# Patient Record
Sex: Male | Born: 1970 | Race: White | Hispanic: No | Marital: Single | Smoking: Never smoker
Health system: Southern US, Community
[De-identification: ages and names within clinical notes are randomized; demographics above are authoritative.]

## PROBLEM LIST (undated history)

## (undated) DIAGNOSIS — J45909 Unspecified asthma, uncomplicated: Secondary | ICD-10-CM

## (undated) HISTORY — PX: THYROIDECTOMY, PARTIAL: SHX18

## (undated) HISTORY — PX: ADENOIDECTOMY: SUR15

## (undated) HISTORY — PX: TYMPANOSTOMY TUBE PLACEMENT: SHX32

## (undated) HISTORY — PX: NASAL SINUS SURGERY: SHX719

## (undated) HISTORY — PX: TONSILLECTOMY: SUR1361

## (undated) HISTORY — DX: Unspecified asthma, uncomplicated: J45.909

---

## 2010-05-27 ENCOUNTER — Emergency Department (HOSPITAL_COMMUNITY): Admission: EM | Admit: 2010-05-27 | Discharge: 2010-05-27 | Payer: Self-pay | Admitting: Emergency Medicine

## 2011-01-13 LAB — CBC
MCHC: 34.4 g/dL (ref 30.0–36.0)
RDW: 13.9 % (ref 11.5–15.5)
WBC: 6.4 10*3/uL (ref 4.0–10.5)

## 2011-01-13 LAB — POCT I-STAT, CHEM 8
Glucose, Bld: 96 mg/dL (ref 70–99)
HCT: 43 % (ref 39.0–52.0)
Hemoglobin: 14.6 g/dL (ref 13.0–17.0)
Potassium: 4.3 mEq/L (ref 3.5–5.1)
Sodium: 138 mEq/L (ref 135–145)

## 2011-01-13 LAB — COMPREHENSIVE METABOLIC PANEL
AST: 36 U/L (ref 0–37)
Alkaline Phosphatase: 80 U/L (ref 39–117)
CO2: 26 mEq/L (ref 19–32)
Calcium: 8.9 mg/dL (ref 8.4–10.5)
Creatinine, Ser: 1.07 mg/dL (ref 0.4–1.5)
GFR calc non Af Amer: >60 mL/min (ref >60)
Potassium: 4.3 mEq/L (ref 3.5–5.1)
Sodium: 138 mEq/L (ref 135–145)

## 2011-01-13 LAB — DIFFERENTIAL
Basophils Absolute: 0 10*3/uL (ref 0.0–0.1)
Basophils Relative: 0 % (ref 0–1)
Eosinophils Absolute: 0.4 10*3/uL (ref 0.0–0.7)
Eosinophils Relative: 6 % — ABNORMAL HIGH (ref 0–5)
Lymphs Abs: 1.4 10*3/uL (ref 0.7–4.0)
Monocytes Relative: 10 % (ref 3–12)
Neutro Abs: 3.9 10*3/uL (ref 1.7–7.7)

## 2011-01-13 LAB — URINALYSIS, ROUTINE W REFLEX MICROSCOPIC
Ketones, ur: NEGATIVE mg/dL
Leukocytes, UA: NEGATIVE
Nitrite: NEGATIVE
Urobilinogen, UA: 0.2 mg/dL (ref 0.0–1.0)

## 2011-01-13 LAB — URINE MICROSCOPIC-ADD ON

## 2011-01-13 LAB — LIPASE, BLOOD: Lipase: 30 U/L (ref 11–59)

## 2012-01-05 ENCOUNTER — Encounter (HOSPITAL_COMMUNITY): Payer: Self-pay | Admitting: *Deleted

## 2012-01-05 ENCOUNTER — Emergency Department (HOSPITAL_COMMUNITY)
Admission: EM | Admit: 2012-01-05 | Discharge: 2012-01-05 | Disposition: A | Discharge status: Home / Self Care | Payer: PRIVATE HEALTH INSURANCE | Attending: Emergency Medicine | Admitting: Emergency Medicine

## 2012-01-05 DIAGNOSIS — R599 Enlarged lymph nodes, unspecified: Secondary | ICD-10-CM | POA: Insufficient documentation

## 2012-01-05 DIAGNOSIS — R591 Generalized enlarged lymph nodes: Secondary | ICD-10-CM

## 2012-01-05 MED ORDER — HYDROCODONE-ACETAMINOPHEN 5-325 MG PO TABS
1.0000 | ORAL_TABLET | Freq: Once | ORAL | Status: AC
  Administered 2012-01-05: 1 via ORAL
  Filled 2012-01-05: qty 1

## 2012-01-05 MED ORDER — HYDROCODONE-ACETAMINOPHEN 5-325 MG PO TABS: ORAL_TABLET | ORAL | Status: AC

## 2012-01-05 NOTE — ED Provider Notes (Signed)
History     CSN: [id number]  Arrival date & time 01/05/12  1706   First MD Initiated Contact with Patient 01/05/12 1746      Chief Complaint  Patient presents with  . swollen neck gland     (Consider location/radiation/quality/duration/timing/severity/associated sxs/prior treatment) HPI Comments: Patient presents with 2 day history of the swelling underneath his right jaw. Patient states that he has a sore throat as well. Denies trouble swallowing or breathing. No shortness of breath. Patient was seen by his primary care physician today and prescribed dicloxacillin. Patient states that the swelling continued to get worse and is painful so he decided to come to the emergency department for evaluation. No fever. No nasal congestion, runny nose. Patient has taken Motrin without relief. Nothing makes symptoms better. Swallowing makes the symptoms worse.  The history is provided by the patient.    History reviewed. No pertinent past medical history.  Past Surgical History  Procedure Date  . Tonsillectomy   . Thyroidectomy, partial   . Nasal sinus surgery     History reviewed. No pertinent family history.  History  Substance Use Topics  . Smoking status: Never Smoker   . Smokeless tobacco: Not on file  . Alcohol Use: No      Review of Systems  Constitutional: Negative for fever.  HENT: Positive for sore throat. Negative for ear pain, rhinorrhea and dental problem.   Eyes: Negative for redness.  Respiratory: Negative for cough, shortness of breath and stridor.   Gastrointestinal: Negative for nausea, vomiting, abdominal pain and diarrhea.  Genitourinary: Negative for dysuria.  Musculoskeletal: Negative for myalgias.  Skin: Negative for rash.  Neurological: Negative for headaches.  Hematological: Positive for adenopathy.    Allergies  Latex  Home Medications   Current Outpatient Rx  Name Route Sig Dispense Refill  . ALBUTEROL SULFATE HFA 108 (90 BASE) MCG/ACT IN  AERS Inhalation Inhale 2 puffs into the lungs every 6 (six) hours as needed. Shortness of breath cough    . DM-GUAIFENESIN ER 30-600 MG PO TB12 Oral Take 1 tablet by mouth every 12 (twelve) hours.    Marland Kitchen DEXTROMETHORPHAN-GUAIFENESIN 10-100 MG/5ML PO SYRP Oral Take 5 mLs by mouth every 4 (four) hours.    Marland Kitchen DICLOXACILLIN SODIUM 500 MG PO CAPS Oral Take 500 mg by mouth 4 (four) times daily.    Marland Kitchen LORATADINE 10 MG PO TABS Oral Take 10 mg by mouth daily.    Marland Kitchen OMEPRAZOLE 20 MG PO CPDR Oral Take 20 mg by mouth 2 (two) times daily.    Marland Kitchen ROSUVASTATIN CALCIUM 20 MG PO TABS Oral Take 20 mg by mouth daily.      BP 155/104  Pulse 107  Temp(Src) 99.1 F (37.3 C) (Oral)  Resp 20  SpO2 97%  Physical Exam  Nursing note and vitals reviewed. Constitutional: He is oriented to person, place, and time. He appears well-developed and well-nourished.  HENT:  Head: Normocephalic and atraumatic.  Right Ear: Hearing, tympanic membrane and ear canal normal.  Left Ear: Hearing, tympanic membrane and ear canal normal.  Nose: Nose normal.  Mouth/Throat: Uvula is midline and oropharynx is clear and moist.  Eyes: Conjunctivae are normal. Right eye exhibits no discharge. Left eye exhibits no discharge.  Neck: Normal range of motion. Neck supple.       3-4 cm, tender, right tonsillar lymph node palpable. There is no overlying or surrounding erythema.  Cardiovascular: Normal rate, regular rhythm and normal heart sounds.   Pulmonary/Chest: Effort normal  and breath sounds normal.  Abdominal: Soft. There is no tenderness.  Lymphadenopathy:    He has cervical adenopathy.  Neurological: He is alert and oriented to person, place, and time.  Skin: Skin is warm and dry.  Psychiatric: He has a normal mood and affect.    ED Course  Procedures (including critical care time)  Labs Reviewed - No data to display No results found.   1. Lymphadenopathy     6:25 PM Patient seen and examined. Medications ordered.   Vital  signs reviewed and are as follows: Filed Vitals:   01/05/12 1757  BP: 155/104  Pulse:   Temp:   Resp:    BP 155/104  Pulse 107  Temp(Src) 99.1 F (37.3 C) (Oral)  Resp 20  SpO2 97%  Patient urged to continue antibiotics and followup with primary care physician next week. Patient urged to return with worsening of swelling, trouble breathing, persistent fever, or other concerns. Patient verbalizes understanding and agrees with plan. Patient informed of that he will need to see his primary care doctor if he continues to have swelling for more than 2 weeks.  6:25 PM Patient counseled on use of narcotic pain medications. Counseled not to combine these medications with others containing tylenol. Urged not to drink alcohol, drive, or perform any other activities that requires focus while taking these medications. The patient verbalizes understanding and agrees with the plan.   MDM  Patient was sore throat and right tonsillar swelling consistent with a reactive lymph node. Patient has only had this for 2 days. He denies lymphadenopathy elsewhere. Do not suspect abscess or lymphadenitis. Do not suspect deep space neck infection or dental abscess. No signs of Ludwig angina. No fever. Will have patient continue antibiotics to treat a possible infection. He has primary care followup.        Renne Crigler, [place] 01/05/12 [phone]

## 2012-01-05 NOTE — ED Notes (Signed)
Swollen gland in his rt neck since Thursday evening. He was seen at urgent care this am but is not satisfied and came to have another visit

## 2012-01-05 NOTE — Discharge Instructions (Signed)
Please read and follow all provided instructions.  Your diagnoses today include:  1. Lymphadenopathy     Tests performed today include:  Vital signs. See below for your results today.   Medications prescribed:   Vicodin (hydrocodone/acetaminophen) - narcotic pain medication  You have been prescribed narcotic pain medication such as Vicodin or Percocet: DO NOT drive or perform any activities that require you to be awake and alert because this medicine can make you drowsy. Do not take any other medications that contain tylenol while taking this medication because you might take too much.   Take any prescribed medications only as directed.  Home care instructions:  Follow any educational materials contained in this packet.  Continue taking antibiotics as prescribed by your doctor.   Follow-up instructions: Please follow-up with your primary care provider in the next 3 days for further evaluation of your symptoms. If you do not have a primary care doctor -- see below for referral information.   Return instructions:   Please return to the Emergency Department if you experience worsening symptoms, worsening swelling of your throat, trouble breathing, persistent fever.  Please return if you have any other emergent concerns.  Additional Information:  Your vital signs today were: BP 155/104  Pulse 107  Temp(Src) 99.1 F (37.3 C) (Oral)  Resp 20  SpO2 97% If your blood pressure (BP) was elevated above 135/85 this visit, please have this repeated by your doctor within one month. -------------- No Primary Care Doctor Call [facility]  [phone] Other agencies that provide inexpensive medical care    [facility]  [phone]    [facility]  [phone]    [facility]  [phone]    [facility]  [phone]    [facility]  [phone]    [facility]  [phone] -------------- RESOURCE GUIDE:  Dental Problems  Patients with  Medicaid: [facility] [phone]                                            [phone] [address] [employer] Phone:  [phone]                                                   Phone:  [phone]  If unable to pay or uninsured, contact:  [facility] or [facility]. to become qualified for the adult dental clinic.  Chronic Pain Problems Contact [facility]  [phone] Patients need to be referred by their primary care doctor.  Insufficient Money for Medicine Contact [employer]:  call "211" or [facility] [phone].  Psychological Services [facility]  [phone] [facility]  [phone] [facility]   [phone] (emergency services [phone])  Substance Abuse Resources [facility]  [phone] [facility] [phone] [facility] [place] [phone] Floydene Flock [phone] Residential & Outpatient Substance Abuse Program  [phone]  Abuse/Neglect [facility] Child Abuse Hotline [phone] [facility] Child Abuse Hotline [phone] (After Hours)  Emergency Shelter [facility] [phone]  Maternity Homes  Room at the [facility] [phone] [employer] Services [phone]  [facility] Resources  [facility] [place]     [employer]                          [facility] Dept. [address]                        [address]      [address] [place] [address]  Blondell Reveal Phone:  [phone]                                   Phone:  [phone]                 Phone:  [phone]  [facility] Phone:  [phone]  [facility] Child Abuse Hotline [phone] (After Hours)

## 2012-01-05 NOTE — ED Provider Notes (Signed)
Medical screening examination/treatment/procedure(s) were performed by non-physician practitioner and as supervising physician I was immediately available for consultation/collaboration.  Gerhard Munch, MD 01/05/12 [phone]

## 2014-12-27 ENCOUNTER — Other Ambulatory Visit: Payer: Self-pay | Admitting: Family Medicine

## 2014-12-27 ENCOUNTER — Ambulatory Visit
Admission: RE | Admit: 2014-12-27 | Discharge: 2014-12-27 | Disposition: A | Discharge status: Home / Self Care | Payer: BLUE CROSS/BLUE SHIELD | Source: Ambulatory Visit | Attending: Family Medicine | Admitting: Family Medicine

## 2014-12-27 DIAGNOSIS — S93601A Unspecified sprain of right foot, initial encounter: Secondary | ICD-10-CM

## 2014-12-27 DIAGNOSIS — T148XXA Other injury of unspecified body region, initial encounter: Secondary | ICD-10-CM

## 2014-12-29 ENCOUNTER — Other Ambulatory Visit: Payer: Self-pay | Admitting: Family Medicine

## 2014-12-29 DIAGNOSIS — R9389 Abnormal findings on diagnostic imaging of other specified body structures: Secondary | ICD-10-CM

## 2014-12-29 DIAGNOSIS — S99921D Unspecified injury of right foot, subsequent encounter: Secondary | ICD-10-CM

## 2014-12-30 ENCOUNTER — Ambulatory Visit
Admission: RE | Admit: 2014-12-30 | Discharge: 2014-12-30 | Disposition: A | Discharge status: Home / Self Care | Payer: BLUE CROSS/BLUE SHIELD | Source: Ambulatory Visit | Attending: Family Medicine | Admitting: Family Medicine

## 2014-12-30 DIAGNOSIS — S99921D Unspecified injury of right foot, subsequent encounter: Secondary | ICD-10-CM

## 2014-12-30 DIAGNOSIS — R9389 Abnormal findings on diagnostic imaging of other specified body structures: Secondary | ICD-10-CM

## 2015-01-06 ENCOUNTER — Other Ambulatory Visit: Payer: BLUE CROSS/BLUE SHIELD

## 2015-10-21 IMAGING — CR DG FOOT COMPLETE 3+V*R*
3 series · 3 of 3 positions shown · non-contrast
Comparison: None.

CLINICAL DATA: Injury of the right foot on December 22, 2014 with
persistent pain and swelling

EXAM:
RIGHT FOOT COMPLETE - 3+ VIEW

[t foot ap right]
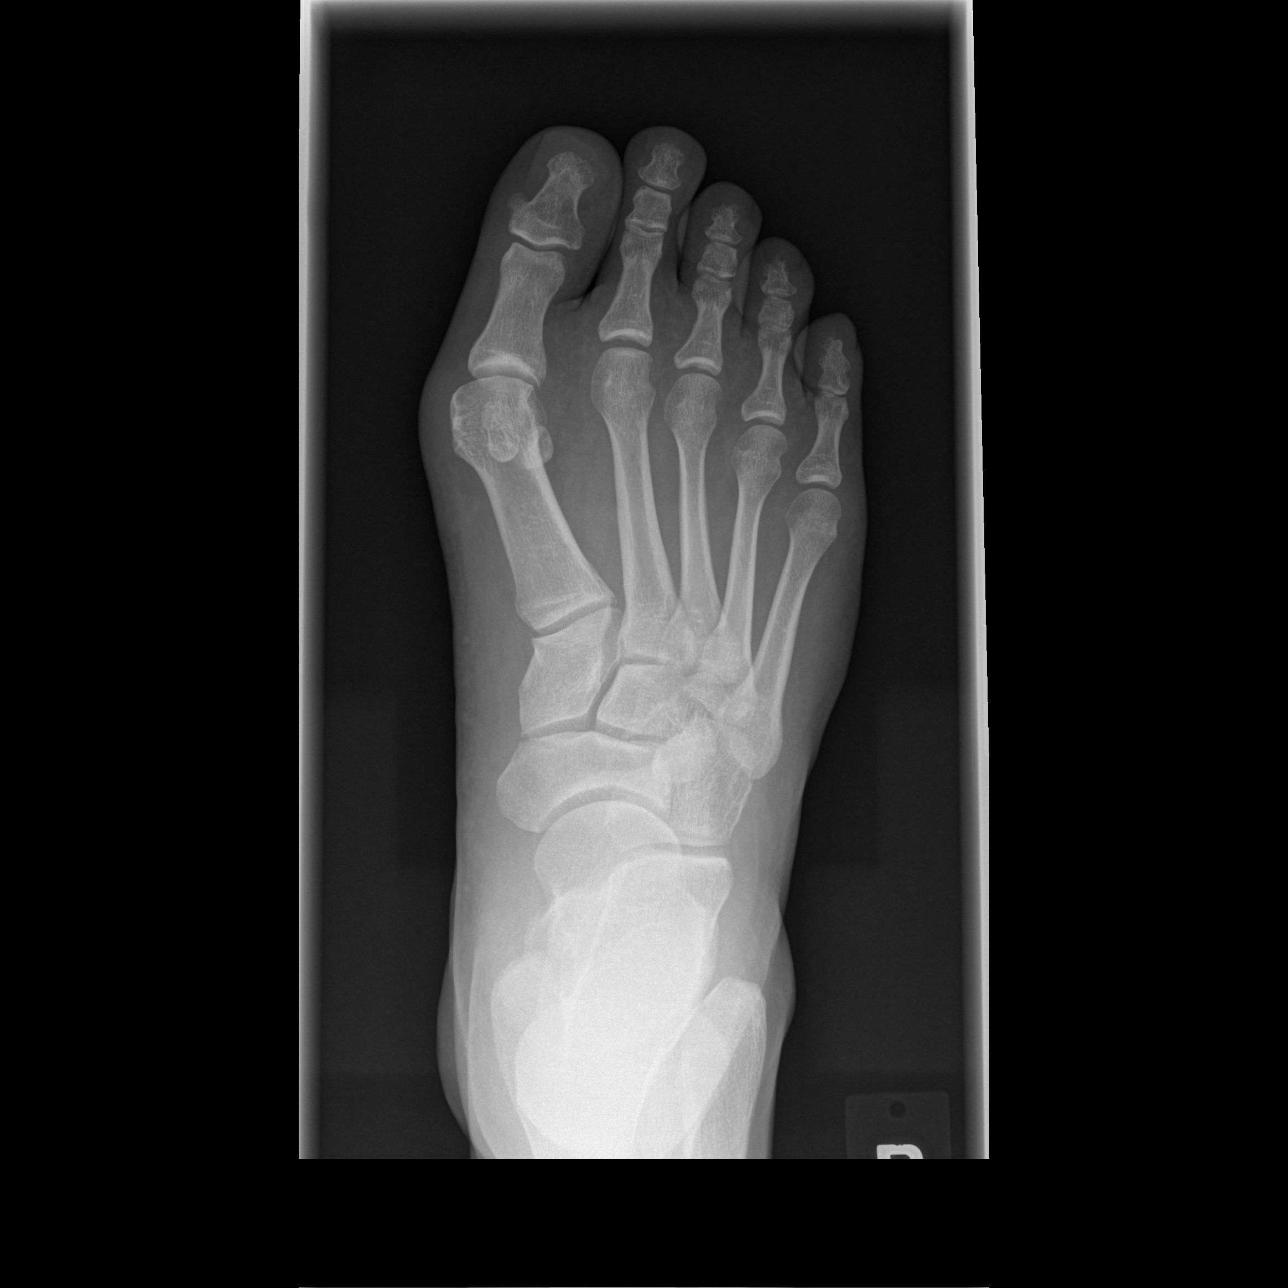

[t foot oblique right]
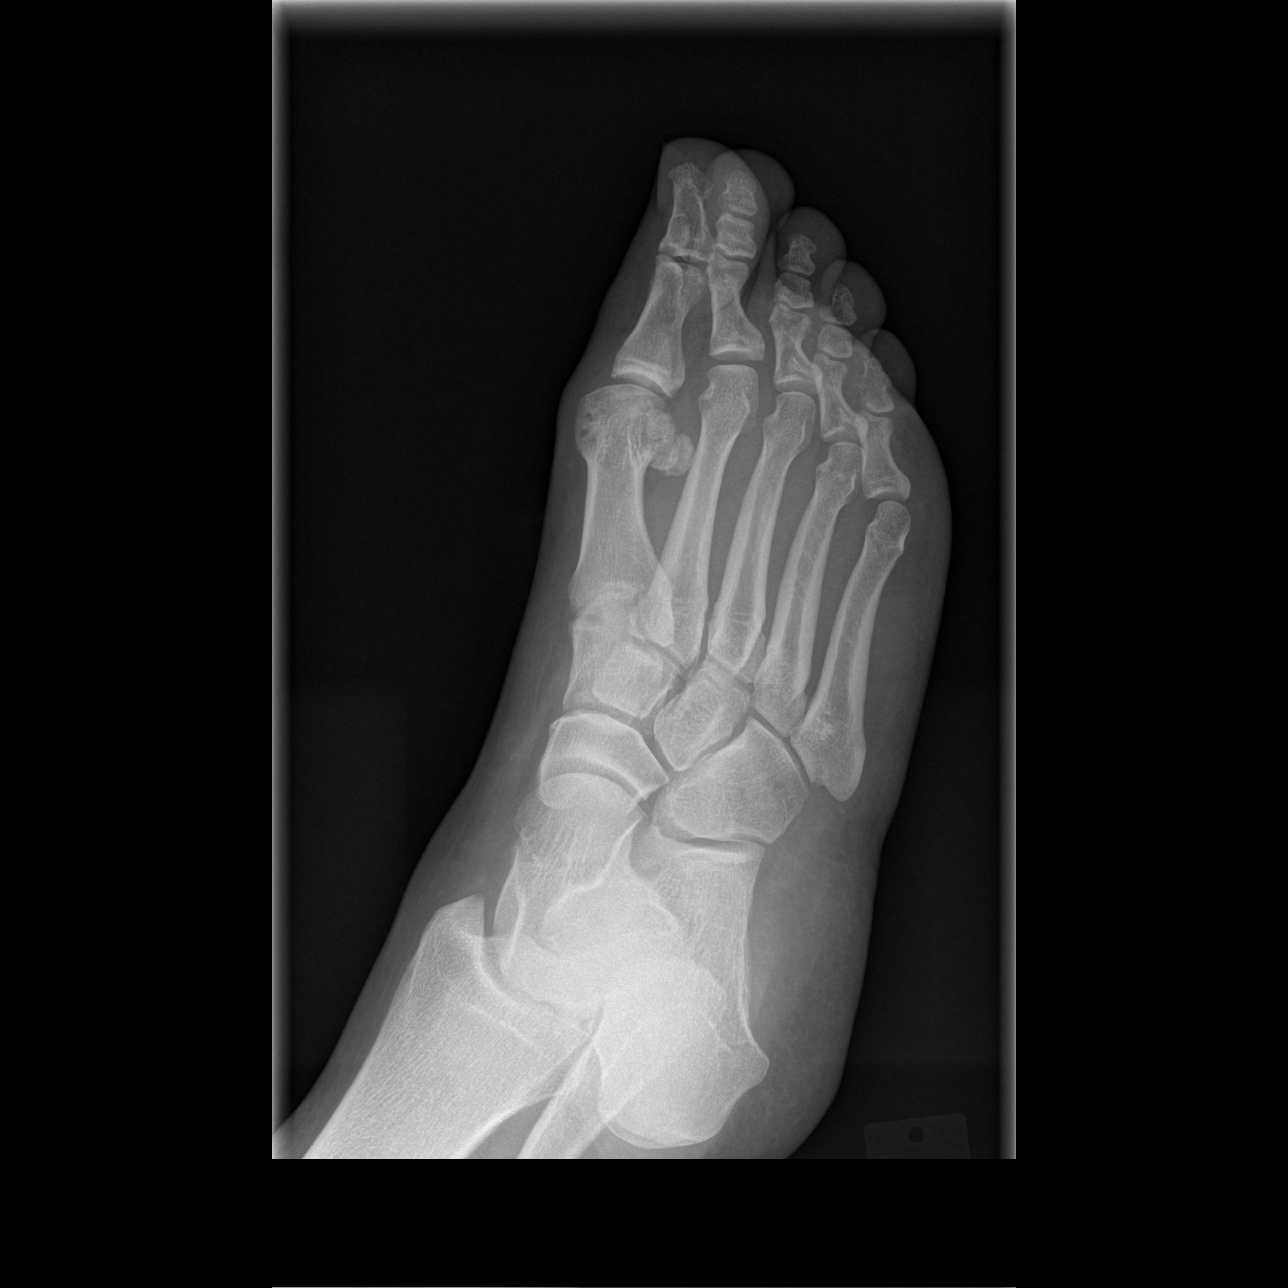

[t foot lat right]
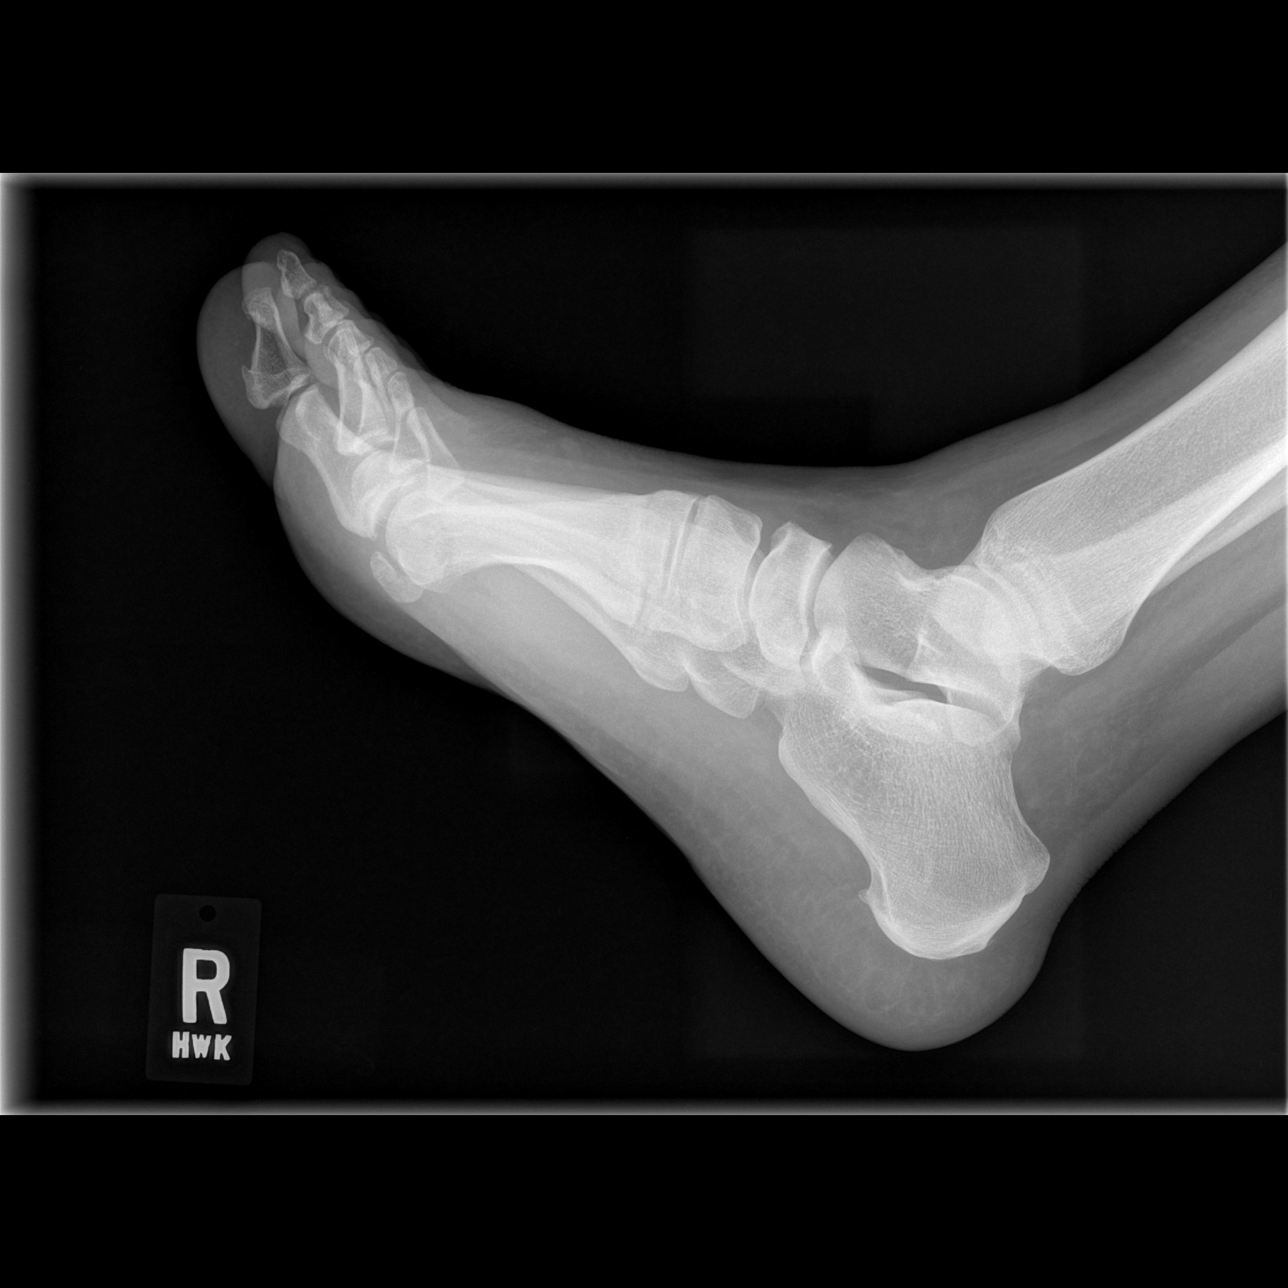

[3 of 3 positions shown; findings below may reference images not displayed]

FINDINGS: The bones of the right foot are adequately mineralized. There is a
hallux valgus contour of the first ray. The interphalangeal and
metatarsophalangeal joint spaces are reasonably well maintained. On
the AP view the articulation of the third cuneiform and cuboid with
the third and fourth metatarsals is unusual in appearance. On the
other views definite no definite abnormality is seen here. The talus
and calcaneus are unremarkable. There is a small plantar calcaneal
spur. There is soft tissue swelling over the midfoot.
IMPRESSION: Unusual contour of the lateral tarsometatarsal joints on the oblique
view merits further evaluation with CT scanning of the right foot to
exclude an occult fracture/dislocation.

## 2015-10-24 IMAGING — CT CT FOOT*R* W/O CM
4 series · 17 of 34 positions shown, 19 images · non-contrast
Comparison: Radiographs dated 12/27/2014

CLINICAL DATA: Pain and swelling of the right foot since an injury
on 12/22/2014. Abnormal radiographs dated 12/27/2014.

EXAM:
CT OF THE RIGHT FOOT WITHOUT CONTRAST
TECHNIQUE: Multidetector CT imaging of the right foot was performed according
to the standard protocol. Multiplanar CT image reconstructions were
also generated.

[Series 5: lower ext soft · axial · 0.54mm/px · z∈[-158,-106]mm · 3 of 63 slices shown]
[im 11/63  soft-tissue]
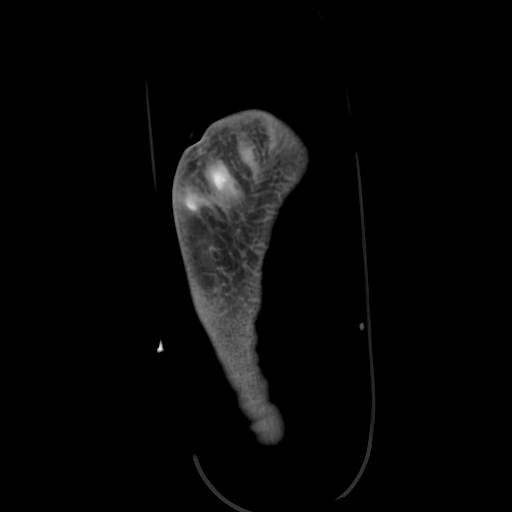
[im 21/63  soft-tissue]
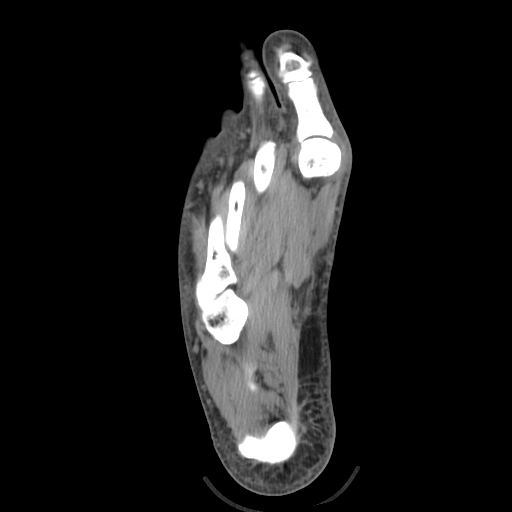
[im 32/63  soft-tissue]
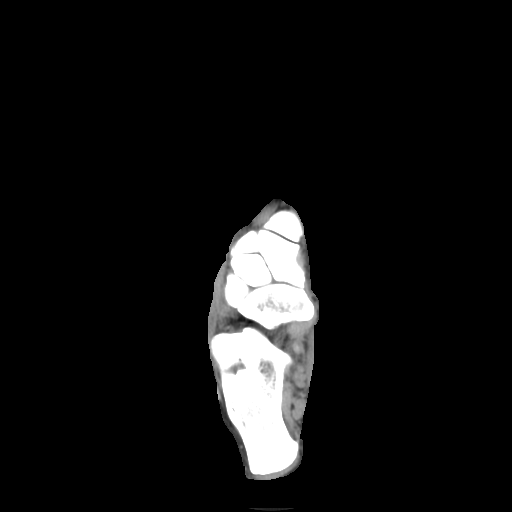

[Series 300: axial soft · coronal · 0.31mm/px · 3 of 125 slices shown]
[im 54/125  bone]
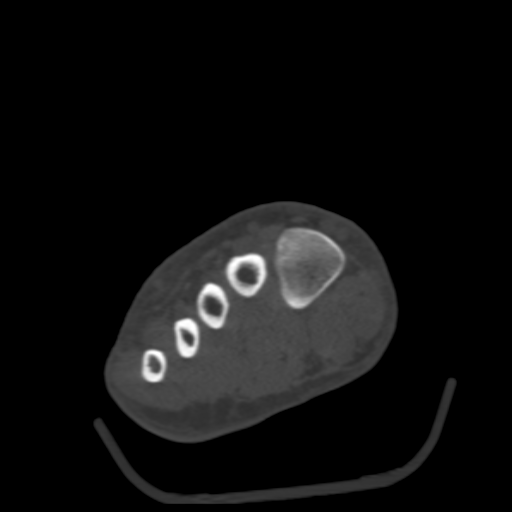
[im 67/125  bone]
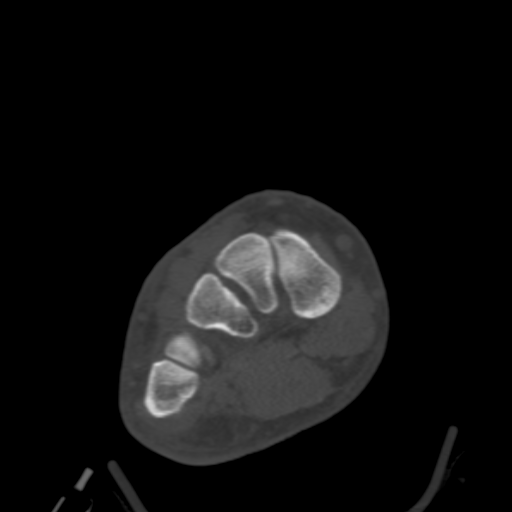
[im 80/125  bone]
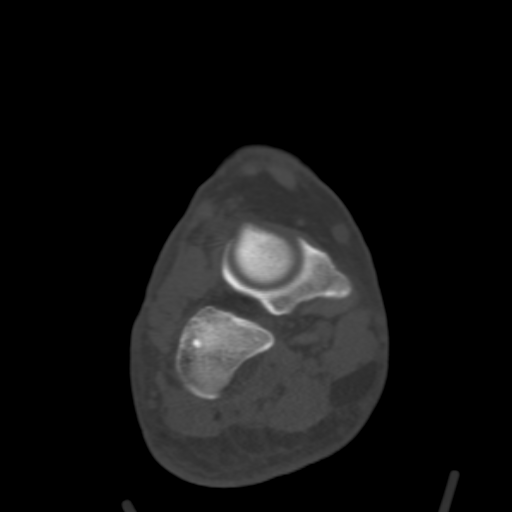

[Series 301: sag foot soft · sagittal · 0.53mm/px · 6 of 52 slices shown]
[im 18/52  bone]
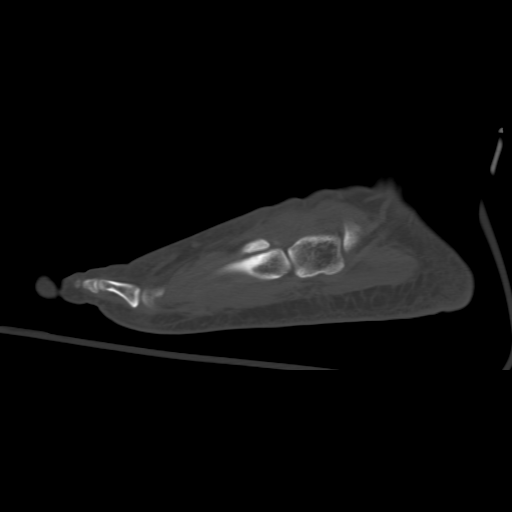
[im 22/52  bone]
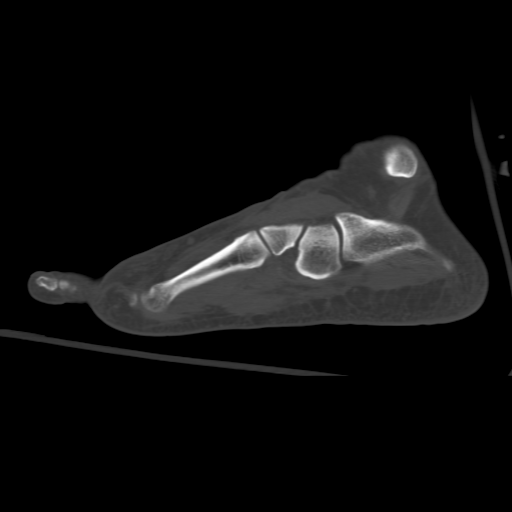
[im 26/52  bone]
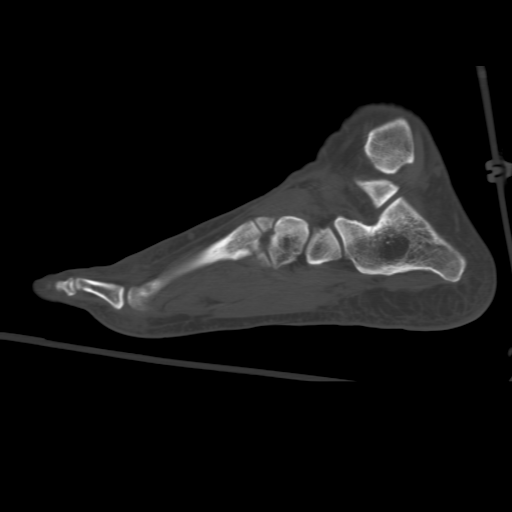
[im 30/52  bone]
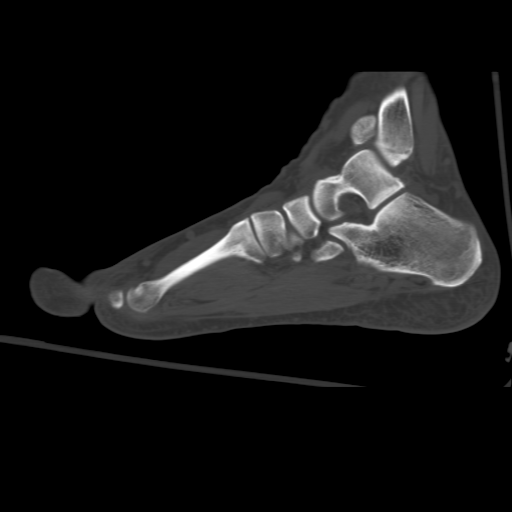
[im 32/52  soft-tissue]
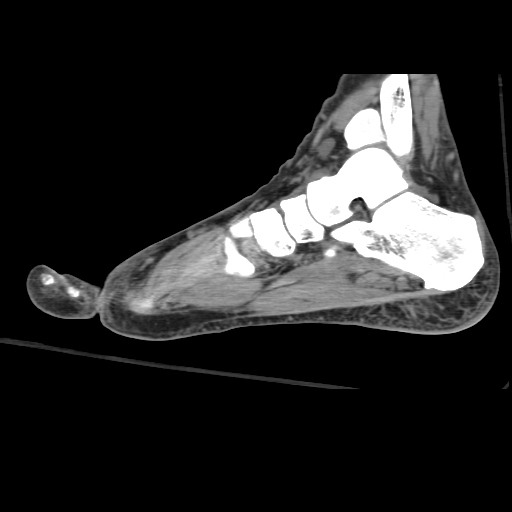
[im 35/52  bone]
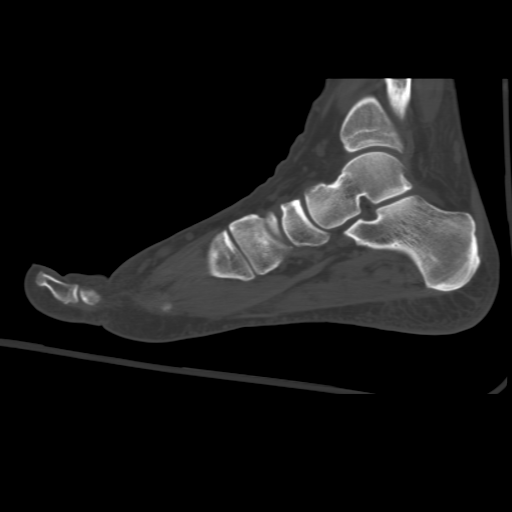

[Series 302: cor soft · axial · 0.53mm/px · z∈[-212,-136]mm · 5 of 62 slices shown, 7 images]
[im 11/62  soft-tissue]
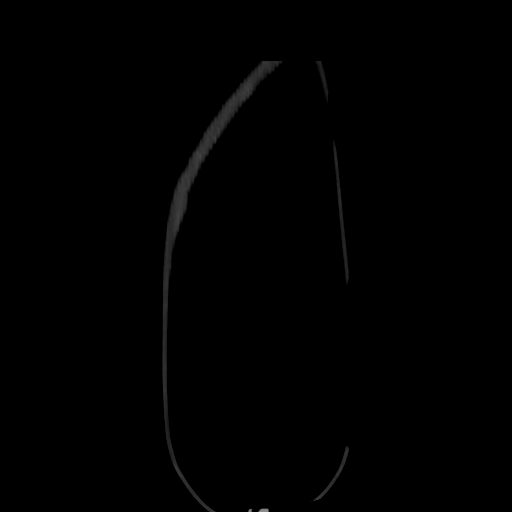
[im 11/62  bone]
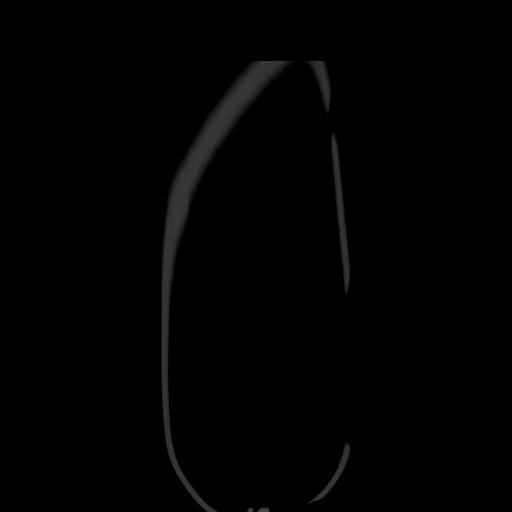
[im 21/62  bone]
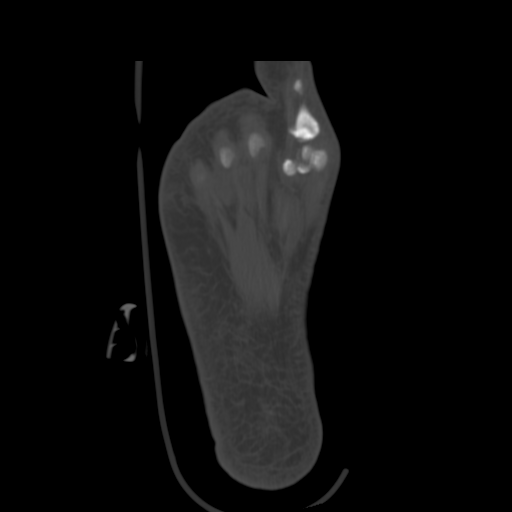
[im 31/62  bone]
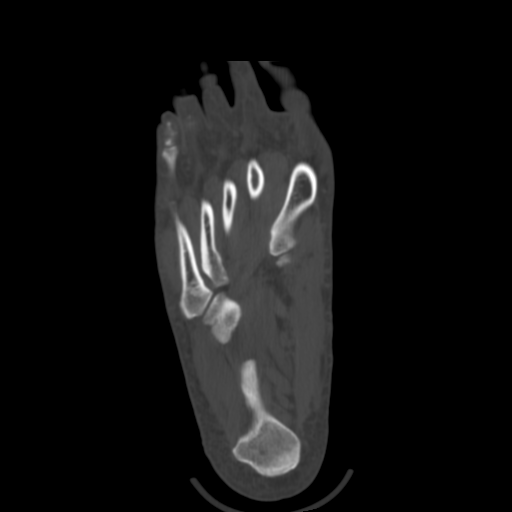
[im 41/62  bone]
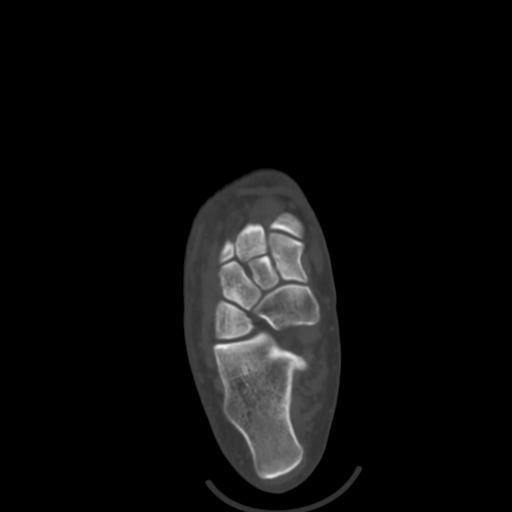
[im 51/62  soft-tissue]
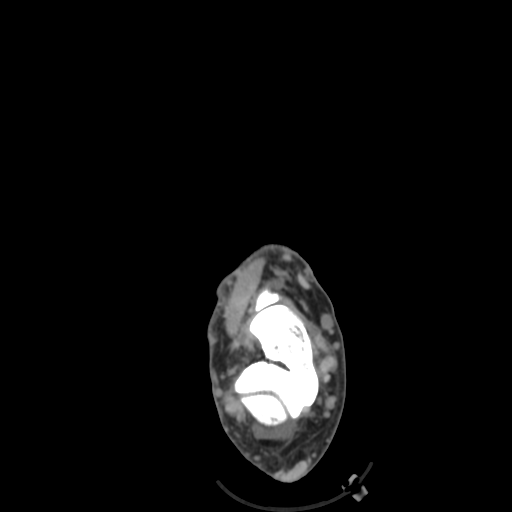
[im 51/62  bone]
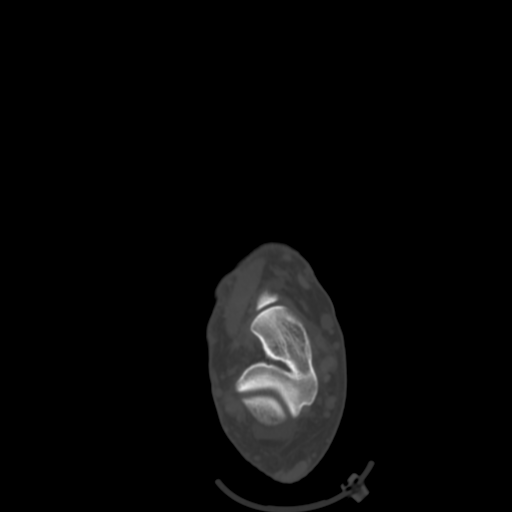

[17 of 34 positions shown; findings below may reference images not displayed]

FINDINGS: There is no fracture or dislocation. There is edema in the
subcutaneous soft tissues at the dorsal aspect of the metatarsal
phalangeal joints. There is slight hallux valgus deformity with
slight bunion formation.

The articulations of the tarsal metatarsal joints appear normal on
the CT scan.
IMPRESSION: No osseous abnormality. No evidence of ligamentous injury. Distal
dorsal subcutaneous edema of the forefoot.

## 2016-03-02 DIAGNOSIS — J309 Allergic rhinitis, unspecified: Secondary | ICD-10-CM | POA: Diagnosis not present

## 2016-03-02 DIAGNOSIS — H6980 Other specified disorders of Eustachian tube, unspecified ear: Secondary | ICD-10-CM | POA: Diagnosis not present

## 2016-03-02 DIAGNOSIS — H9201 Otalgia, right ear: Secondary | ICD-10-CM | POA: Diagnosis not present

## 2016-04-12 ENCOUNTER — Ambulatory Visit (INDEPENDENT_AMBULATORY_CARE_PROVIDER_SITE_OTHER): Payer: BLUE CROSS/BLUE SHIELD | Admitting: Allergy and Immunology

## 2016-04-12 ENCOUNTER — Encounter: Payer: Self-pay | Admitting: Allergy and Immunology

## 2016-04-12 VITALS — BP 120/88 | HR 96 | Temp 97.9°F | Resp 18

## 2016-04-12 DIAGNOSIS — J45901 Unspecified asthma with (acute) exacerbation: Secondary | ICD-10-CM | POA: Diagnosis not present

## 2016-04-12 DIAGNOSIS — H1010 Acute atopic conjunctivitis, unspecified eye: Secondary | ICD-10-CM | POA: Diagnosis not present

## 2016-04-12 DIAGNOSIS — J309 Allergic rhinitis, unspecified: Secondary | ICD-10-CM

## 2016-04-12 MED ORDER — BECLOMETHASONE DIPROPIONATE 80 MCG/ACT IN AERS: 2.0000 | INHALATION_SPRAY | Freq: Two times a day (BID) | RESPIRATORY_TRACT | Status: DC

## 2016-04-12 MED ORDER — ALBUTEROL SULFATE HFA 108 (90 BASE) MCG/ACT IN AERS: 2.0000 | INHALATION_SPRAY | RESPIRATORY_TRACT | Status: DC | PRN

## 2016-04-12 MED ORDER — LEVOCETIRIZINE DIHYDROCHLORIDE 5 MG PO TABS: 5.0000 mg | ORAL_TABLET | Freq: Every evening | ORAL | Status: DC

## 2016-04-12 NOTE — Progress Notes (Signed)
FOLLOW UP NOTE  RE: SHALON COUNCILMAN MRN: [id number] DOB: [birth year] [facility] [address] [place] [address] Date of Office Visit: 04/12/2016  Subjective:  BULMARO FEAGANS III is a 45 y.o. male who presents today for Asthma; Cough; and Wheezing  Assessment:   1. Allergic rhinoconjunctivitis.   2. Asthma with acute exacerbation, Afebrile and in no respiratory distress, responsive to bronchodilator.    3.      History of hypertension on daily ACE inhibitor. 4.      History of GEReflux, currently asymptomatic. Plan:   Meds ordered this encounter  Medications  . beclomethasone (QVAR) 80 MCG/ACT inhaler    Sig: Inhale 2 puffs into the lungs 2 (two) times daily. Rinse, Gargle and Spit after use.    Dispense:  1 Inhaler    Refill:  3  . levocetirizine (XYZAL) 5 MG tablet    Sig: Take 1 tablet (5 mg total) by mouth every evening.    Dispense:  30 tablet    Refill:  5  . albuterol (PROAIR HFA) 108 (90 Base) MCG/ACT inhaler    Sig: Inhale 2 puffs into the lungs every 4 (four) hours as needed for wheezing or shortness of breath.    Dispense:  1 Inhaler    Refill:  2  1. Prednisone 40 mg now, 30 mg once daily for 2 days, 20 mg once daily and 10 mg the last day. 2. Antihistamine: Xyzal 5 mg by mouth once daily for runny nose or itching.  (Stop loratadine) 3. Nasal Spray: Nasacort AQ 1-2 spray(s) each nostril once daily for stuffy nose or drainage. (Stop fluticasone) 4. Inhalers:  Rescue: ProAir puffs every 4 hours as needed for cough or wheeze.       -May use 2 puffs 10-20 minutes prior to exercise.  Preventative: Qvar 2 puffs twice daily (Rinse, gargle, and spit out after use). 5. Eye Drops: Zaditor one drop(s) each eye twice daily for itchy eyes as needed . 6. Nasal Saline wash each evening at shower time.   7. Follow up Visit: one month or sooner if needed.  HPI: Tywon returns to the office with recent cough and wheeze.  He  reports since his last visit here December 2015, he was not able to maintain his preventative medication regime doing due to her insurance coverage though he did not find them helpful.  In the last several weeks he has had increasing symptoms prompting over-the-counter medication use Flonase and Claritin.  When he began with increasing symptoms, received a Ventolin from his primary care physician given the last 10 days of cough, wheeze, shortness of breath, chest congestion since returning from travel to [place].  There has been intermittent rhinorrhea, nasal congestion, postnasal drip, but no fever, sore throat, headache, discolored drainage or productive cough.  He is using Ventolin several times a day and reports that previously use Qvar and Xyzal were beneficial.  He does note nocturnal cough, and occasional activity induced symptoms.  Denies ED or urgent care visits, prednisone or antibiotic courses.   Pinchus has a current medication list which includes the following prescription(s): albuterol, atorvastatin, lisinopril, loratadine and OTC fluticasone.  Drug Allergies: Allergies  Allergen Reactions  . Dicloxacillin Anaphylaxis  . Latex Rash   Objective:   Filed Vitals:   04/12/16 1753  BP: 120/88  Pulse: 96  Temp: 97.9 F (36.6 C)  Resp: 18   SpO2 Readings from Last 1 Encounters:  04/12/16 97%   Physical Exam  Constitutional: He is well-developed, well-nourished, and in no distress.  Communicating easily in full sentences with intermittent cough.  HENT:  Head: Atraumatic.  Right Ear: Tympanic membrane and ear canal normal.  Left Ear: Tympanic membrane and ear canal normal.  Nose: Mucosal edema present. No rhinorrhea. No epistaxis.  Mouth/Throat: Oropharynx is clear and moist and mucous membranes are normal. No oropharyngeal exudate, posterior oropharyngeal edema or posterior oropharyngeal erythema.  Eyes: Conjunctivae are normal.  Neck: Neck supple.  Cardiovascular: Normal rate, S1  normal and S2 normal.   No murmur heard. Pulmonary/Chest: Effort normal. He has no decreased breath sounds. He has wheezes (posterior right with good aeration.). He has no rhonchi. He has no rales.  Post Xopenex/Atrovent neb: Improved excellent aeration with clear breath sounds throughout, patient reports improved.  Continues to be free of  rhonchi or crackles.  Lymphadenopathy:    He has no cervical adenopathy.  Skin: Skin is warm and intact. No rash noted. No cyanosis. Nails show no clubbing.    Diagnostics: Spirometry:  FVC 2.86---64%, FEV1 2.26--62% postbronchodilator improvement  FVC  3.18--71%, FEV1 2.52--69%.  ACT= 13.    Roselyn M. Willa RoughHicks, MD  cc: Leanor RubensteinSUN,VYVYAN Y, MD

## 2016-04-12 NOTE — Patient Instructions (Signed)
Take Home Sheet  1. Prednisone 40 mg now, 30 mg once daily for 2 days, 20 mg once daily and 10 mg the last day.   2. Antihistamine: Xyzal 5 mg by mouth once daily for runny nose or itching.  (Stop loratadine)   3. Nasal Spray: Nasacort AQ 1-2 spray(s) each nostril once daily for stuffy nose or drainage. (Stop fluticasone)   4. Inhalers:  Rescue: ProAir puffs every 4 hours as needed for cough or wheeze.       -May use 2 puffs 10-20 minutes prior to exercise.   Preventative: Qvar 80mcg 2 puffs twice daily (Rinse, gargle, and spit out after use).   5. Eye Drops: Zaditor one drop(s) each eye twice daily for itchy eyes as needed .   6. Nasal Saline wash each evening at shower time.     7. Follow up Visit: one month or sooner if needed.   Websites that have reliable Patient information: 1. American Academy of Asthma, Allergy, & Immunology: www.aaaai.org 2. Food Allergy Network: www.foodallergy.org 3. Mothers of Asthmatics: www.aanma.org 4. [facility]: https://www.strong.com/www.njc.org 5. American College of Allergy, Asthma, & Immunology: [employer]ger[employer].iswww.allergy.mcg.edu or www.acaai.org

## 2016-07-18 DIAGNOSIS — Z23 Encounter for immunization: Secondary | ICD-10-CM | POA: Diagnosis not present

## 2016-07-18 DIAGNOSIS — R600 Localized edema: Secondary | ICD-10-CM | POA: Diagnosis not present

## 2016-07-18 DIAGNOSIS — I10 Essential (primary) hypertension: Secondary | ICD-10-CM | POA: Diagnosis not present

## 2016-07-18 DIAGNOSIS — E782 Mixed hyperlipidemia: Secondary | ICD-10-CM | POA: Diagnosis not present

## 2016-09-17 DIAGNOSIS — L718 Other rosacea: Secondary | ICD-10-CM | POA: Diagnosis not present

## 2016-12-11 ENCOUNTER — Other Ambulatory Visit: Payer: Self-pay | Admitting: *Deleted

## 2016-12-11 MED ORDER — ALBUTEROL SULFATE HFA 108 (90 BASE) MCG/ACT IN AERS: 2.0000 | INHALATION_SPRAY | RESPIRATORY_TRACT | 0 refills | Status: DC | PRN

## 2017-01-04 ENCOUNTER — Other Ambulatory Visit: Payer: Self-pay

## 2017-01-04 MED ORDER — BECLOMETHASONE DIPROP HFA 80 MCG/ACT IN AERB: 2.0000 | INHALATION_SPRAY | Freq: Two times a day (BID) | RESPIRATORY_TRACT | 0 refills | Status: DC

## 2017-01-04 NOTE — Telephone Encounter (Signed)
Received fax for the discontinuing Qvar 80. Sent script for Qvar Redihaler 80 for 1 with no refills. Patient needs office visit for further refills.

## 2017-01-15 DIAGNOSIS — I10 Essential (primary) hypertension: Secondary | ICD-10-CM | POA: Diagnosis not present

## 2017-01-15 DIAGNOSIS — L711 Rhinophyma: Secondary | ICD-10-CM | POA: Diagnosis not present

## 2017-01-15 DIAGNOSIS — E782 Mixed hyperlipidemia: Secondary | ICD-10-CM | POA: Diagnosis not present

## 2017-02-07 ENCOUNTER — Other Ambulatory Visit: Payer: Self-pay | Admitting: Allergy & Immunology

## 2017-03-28 DIAGNOSIS — H938X2 Other specified disorders of left ear: Secondary | ICD-10-CM | POA: Diagnosis not present

## 2017-03-28 DIAGNOSIS — A4902 Methicillin resistant Staphylococcus aureus infection, unspecified site: Secondary | ICD-10-CM | POA: Diagnosis not present

## 2017-04-03 DIAGNOSIS — A4902 Methicillin resistant Staphylococcus aureus infection, unspecified site: Secondary | ICD-10-CM | POA: Diagnosis not present

## 2017-04-03 DIAGNOSIS — J309 Allergic rhinitis, unspecified: Secondary | ICD-10-CM | POA: Diagnosis not present

## 2017-04-03 DIAGNOSIS — H9202 Otalgia, left ear: Secondary | ICD-10-CM | POA: Diagnosis not present

## 2017-05-23 DIAGNOSIS — L711 Rhinophyma: Secondary | ICD-10-CM | POA: Diagnosis not present

## 2017-05-23 DIAGNOSIS — Z8709 Personal history of other diseases of the respiratory system: Secondary | ICD-10-CM | POA: Diagnosis not present

## 2017-05-23 DIAGNOSIS — J302 Other seasonal allergic rhinitis: Secondary | ICD-10-CM | POA: Diagnosis not present

## 2017-05-23 DIAGNOSIS — J342 Deviated nasal septum: Secondary | ICD-10-CM | POA: Diagnosis not present

## 2017-05-30 DIAGNOSIS — L711 Rhinophyma: Secondary | ICD-10-CM | POA: Diagnosis not present

## 2017-06-19 DIAGNOSIS — H6003 Abscess of external ear, bilateral: Secondary | ICD-10-CM | POA: Diagnosis not present

## 2017-07-15 DIAGNOSIS — L711 Rhinophyma: Secondary | ICD-10-CM | POA: Diagnosis not present

## 2017-07-15 DIAGNOSIS — I10 Essential (primary) hypertension: Secondary | ICD-10-CM | POA: Diagnosis not present

## 2017-07-15 DIAGNOSIS — K219 Gastro-esophageal reflux disease without esophagitis: Secondary | ICD-10-CM | POA: Diagnosis not present

## 2017-07-15 DIAGNOSIS — J45909 Unspecified asthma, uncomplicated: Secondary | ICD-10-CM | POA: Diagnosis not present

## 2017-07-16 DIAGNOSIS — E782 Mixed hyperlipidemia: Secondary | ICD-10-CM | POA: Diagnosis not present

## 2017-07-16 DIAGNOSIS — I10 Essential (primary) hypertension: Secondary | ICD-10-CM | POA: Diagnosis not present

## 2017-07-16 DIAGNOSIS — Z23 Encounter for immunization: Secondary | ICD-10-CM | POA: Diagnosis not present

## 2017-07-16 DIAGNOSIS — Z0000 Encounter for general adult medical examination without abnormal findings: Secondary | ICD-10-CM | POA: Diagnosis not present

## 2017-07-16 DIAGNOSIS — J45909 Unspecified asthma, uncomplicated: Secondary | ICD-10-CM | POA: Diagnosis not present

## 2017-07-19 DIAGNOSIS — K219 Gastro-esophageal reflux disease without esophagitis: Secondary | ICD-10-CM | POA: Diagnosis not present

## 2017-07-19 DIAGNOSIS — J45909 Unspecified asthma, uncomplicated: Secondary | ICD-10-CM | POA: Diagnosis not present

## 2017-07-19 DIAGNOSIS — L711 Rhinophyma: Secondary | ICD-10-CM | POA: Diagnosis not present

## 2017-07-19 DIAGNOSIS — I10 Essential (primary) hypertension: Secondary | ICD-10-CM | POA: Diagnosis not present

## 2017-07-20 HISTORY — PX: RHINOPLASTY: SUR1284

## 2017-07-30 ENCOUNTER — Other Ambulatory Visit: Payer: Self-pay | Admitting: Allergy & Immunology

## 2017-07-30 NOTE — Telephone Encounter (Signed)
Received a fax for refill on Qvar 80. Patient was last seen 04/12/2016. Request denied patient needs office visit.

## 2017-08-07 ENCOUNTER — Other Ambulatory Visit: Payer: Self-pay

## 2017-08-07 DIAGNOSIS — H6012 Cellulitis of left external ear: Secondary | ICD-10-CM | POA: Diagnosis not present

## 2017-11-11 ENCOUNTER — Ambulatory Visit: Payer: BLUE CROSS/BLUE SHIELD | Admitting: Family Medicine

## 2017-11-11 ENCOUNTER — Encounter: Payer: Self-pay | Admitting: Family Medicine

## 2017-11-11 VITALS — BP 120/85 | HR 103 | Temp 97.9°F | Resp 17 | Ht 68.0 in | Wt 275.0 lb

## 2017-11-11 DIAGNOSIS — J309 Allergic rhinitis, unspecified: Secondary | ICD-10-CM | POA: Diagnosis not present

## 2017-11-11 DIAGNOSIS — H1010 Acute atopic conjunctivitis, unspecified eye: Secondary | ICD-10-CM | POA: Diagnosis not present

## 2017-11-11 DIAGNOSIS — K219 Gastro-esophageal reflux disease without esophagitis: Secondary | ICD-10-CM | POA: Diagnosis not present

## 2017-11-11 DIAGNOSIS — J4531 Mild persistent asthma with (acute) exacerbation: Secondary | ICD-10-CM | POA: Diagnosis not present

## 2017-11-11 MED ORDER — TRIAMCINOLONE ACETONIDE 55 MCG/ACT NA AERO: 2.0000 | INHALATION_SPRAY | Freq: Every day | NASAL | 5 refills | Status: DC

## 2017-11-11 MED ORDER — LEVOCETIRIZINE DIHYDROCHLORIDE 5 MG PO TABS: 5.0000 mg | ORAL_TABLET | Freq: Every evening | ORAL | 5 refills | Status: DC

## 2017-11-11 MED ORDER — FLUTICASONE PROPIONATE HFA 110 MCG/ACT IN AERO: 2.0000 | INHALATION_SPRAY | Freq: Two times a day (BID) | RESPIRATORY_TRACT | 5 refills | Status: DC

## 2017-11-11 MED ORDER — ALBUTEROL SULFATE HFA 108 (90 BASE) MCG/ACT IN AERS: 2.0000 | INHALATION_SPRAY | RESPIRATORY_TRACT | 2 refills | Status: DC | PRN

## 2017-11-11 NOTE — Progress Notes (Addendum)
[address] [place] [id number] Dept: [phone]  FAMILY NURSE PRACTITIONER FOLLOW UP NOTE  Patient ID: Michael Reynolds, male    DOB: 01/17/1971  Age: 47 y.o. MRN: [id number] Date of Office Visit: 11/11/2017  Assessment  Chief Complaint: Asthma  HPI Michael PartridgeJohn H Vittitow Reynolds is a 47 year old male who presents to the clinic today for a sick visit. He was last seen in this clinic on 04/12/2016 by Dr. Willa RoughHicks for evaluation of allergic rhinoconjunctivitis, asthma, history of hypertension, and gastroesophageal reflux. At that visit, he reported that he had not been able to maintain his preventative asthma medication regimen due to insurance coverage issues. He was reporting chest tightness, shortness of breath and cough which required prednisone, Qvar, albuterol inhaler, and antihistamines to resolve.   At today's visit, he is reporting a "raspy wheezy cough" for the last 3 weeks. He reports some shortness of breath and wheezing that is worse with activity. Michael RuizJohn has been out of his Qvar and albuterol inhalers since August 2018. Prior to running out of his inhalers, he used Qvar 2 puffs twice a day and rarely needed his albuterol inhaler.   Rhinitis is reported as moderately well controlled. He has been taking Xyzal and using Nasonex for the past few weeks for nasal congestion and rhinitis with some success.  Gastroesophageal reflux is reported as well controlled with omeprazole 20 mg as needed. He denies any symptoms of heart burn.    Drug Allergies:  Allergies  Allergen Reactions  . Dicloxacillin Anaphylaxis  . Latex Rash    Physical Exam: BP 120/85 (BP Location: Right Arm, Cuff Size: Normal)   Pulse (!) 103   Temp 97.9 F (36.6 C)   Resp 17   Wt 275 lb (124.7 kg)   SpO2 95%    Physical Exam  Constitutional: He is oriented to person, place, and time. He appears well-developed and well-nourished.  HENT:  Right Ear: External ear normal.  Left Ear: External ear normal.    Eyes normal. Ears normal. Bilateral nares erythematous and edematous. Pharynx slightly erythematous without exudate noted.   Eyes: Conjunctivae are normal.  Neck: Normal range of motion. Neck supple.  Cardiovascular: Normal rate, regular rhythm and normal heart sounds.  S1S2 normal. Regular heart rate and rhythm. No murmur noted.  Pulmonary/Chest: Effort normal and breath sounds normal.  Lungs clear to auscultation  Musculoskeletal: Normal range of motion.  Neurological: He is alert and oriented to person, place, and time.  Skin: Skin is warm and dry.  Psychiatric: He has a normal mood and affect. His behavior is normal.    Diagnostics: FVC 2.91, FEV1 2.18.  Predicted FVC 4.85, predicted FEV1 3.81.  Spirometry indicates moderate restriction. Post bronchodilator reading: FVC 3.13, FEV1 2.51.  Post bronchodilator therapy spirometry indicates a 15% increase in FEV1 indicating reversibility.   Assessment and Plan: 1. Mild persistent asthma with acute exacerbation   2. Allergic rhinoconjunctivitis   3. Gastroesophageal reflux disease, esophagitis presence not specified     Meds ordered this encounter  Medications  . fluticasone (FLOVENT HFA) 110 MCG/ACT inhaler    Sig: Inhale 2 puffs into the lungs 2 (two) times daily.    Dispense:  1 Inhaler    Refill:  5  . albuterol (PROAIR HFA) 108 (90 Base) MCG/ACT inhaler    Sig: Inhale 2 puffs into the lungs every 4 (four) hours as needed for wheezing or shortness of breath.    Dispense:  1 Inhaler  Refill:  2  . levocetirizine (XYZAL) 5 MG tablet    Sig: Take 1 tablet (5 mg total) by mouth every evening.    Dispense:  30 tablet    Refill:  5  . triamcinolone (NASACORT) 55 MCG/ACT AERO nasal inhaler    Sig: Place 2 sprays into the nose daily.    Dispense:  1 Inhaler    Refill:  5    Patient Instructions    1. Mild persistent asthma with acute exacerbation - Allergen avoidance measures - Prednisone 10 mg tablets. Take 2 tablets  twice a day for 3 days, then take 2 tablets for 1 day, then take 1 tablet for 1 day, then stop. - Begin Flovent 110 - 2 puffs twice a day to prevent cough, shortness of breath, and wheeze (spacer was provided at the office today) - ProAir 2 puffs every 4 hours with a spacer every 4 hours as needed for shortness of breath or wheezing  2. Allergic rhinoconjunctivitis - Continue Xyzal 5 mg once a day as needed for runny nose - Continue Nasocort  1-2 sprays in each nostril once a day as needed for a stuffy nose - Continue Zatador eye drops as needed for red itchy eyes   3. Gastroesophageal reflux disease, esophagitis presence not specified - Continue omeprazole 20 mg once a day as needed   Follow up in 3 months or sooner as needed   Return in about 3 months (around 02/09/2018).    Thank you for the opportunity to care for this patient.  Please do not hesitate to contact me with questions.  Thermon Leyland, FNP [facility]   I have provided oversight concerning [facility]'s evaluation and treatment of this patient's health issues addressed during today's encounter.  I agree with the assessment and therapeutic plan as outlined in the note.   Signed,   R Jorene Guest, MD

## 2017-11-11 NOTE — Patient Instructions (Addendum)
   1. Mild persistent asthma with acute exacerbation - Allergen avoidance measures - Prednisone 10 mg tablets. Take 2 tablets twice a day for 3 days, then take 2 tablets for 1 day, then take 1 tablet for 1 day, then stop. - Begin Flovent 110 - 2 puffs twice a day to prevent cough, shortness of breath, and wheeze (spacer was provided at the office today) - ProAir 2 puffs every 4 hours with a spacer every 4 hours as needed for shortness of breath or wheezing  2. Allergic rhinoconjunctivitis - Continue Xyzal 5 mg once a day as needed for runny nose - Continue Nasocort  1-2 sprays in each nostril once a day as needed for a stuffy nose - Continue Zatador eye drops as needed for red itchy eyes   3. Gastroesophageal reflux disease, esophagitis presence not specified - Continue omeprazole 20 mg once a day as needed   Follow up in 3 months or sooner as needed

## 2018-01-13 DIAGNOSIS — I10 Essential (primary) hypertension: Secondary | ICD-10-CM | POA: Diagnosis not present

## 2018-01-13 DIAGNOSIS — K219 Gastro-esophageal reflux disease without esophagitis: Secondary | ICD-10-CM | POA: Diagnosis not present

## 2018-01-13 DIAGNOSIS — R600 Localized edema: Secondary | ICD-10-CM | POA: Diagnosis not present

## 2018-01-13 DIAGNOSIS — E782 Mixed hyperlipidemia: Secondary | ICD-10-CM | POA: Diagnosis not present

## 2018-03-07 DIAGNOSIS — J45909 Unspecified asthma, uncomplicated: Secondary | ICD-10-CM | POA: Diagnosis not present

## 2018-03-07 DIAGNOSIS — R05 Cough: Secondary | ICD-10-CM | POA: Diagnosis not present

## 2018-04-09 ENCOUNTER — Encounter: Payer: Self-pay | Admitting: Allergy & Immunology

## 2018-04-09 ENCOUNTER — Ambulatory Visit: Payer: BLUE CROSS/BLUE SHIELD | Admitting: Allergy & Immunology

## 2018-04-09 VITALS — BP 124/92 | HR 72 | Resp 20

## 2018-04-09 DIAGNOSIS — H1010 Acute atopic conjunctivitis, unspecified eye: Secondary | ICD-10-CM | POA: Diagnosis not present

## 2018-04-09 DIAGNOSIS — J4531 Mild persistent asthma with (acute) exacerbation: Secondary | ICD-10-CM | POA: Diagnosis not present

## 2018-04-09 DIAGNOSIS — J309 Allergic rhinitis, unspecified: Secondary | ICD-10-CM | POA: Diagnosis not present

## 2018-04-09 DIAGNOSIS — K219 Gastro-esophageal reflux disease without esophagitis: Secondary | ICD-10-CM | POA: Diagnosis not present

## 2018-04-09 MED ORDER — MONTELUKAST SODIUM 10 MG PO TABS: 10.0000 mg | ORAL_TABLET | Freq: Every day | ORAL | 5 refills | Status: DC

## 2018-04-09 MED ORDER — BECLOMETHASONE DIPROP HFA 80 MCG/ACT IN AERB: 2.0000 | INHALATION_SPRAY | Freq: Two times a day (BID) | RESPIRATORY_TRACT | 5 refills | Status: AC

## 2018-04-09 NOTE — Progress Notes (Addendum)
FOLLOW UP  Date of Service/Encounter:  04/09/18   Assessment:   Mild persistent asthma with acute exacerbation  Gastroesophageal reflux disease  Perennial rhinoconjunctivitis (dust mites, cat)   Plan/Recommendations:   1. Mild persistent asthma, uncomplicated - Lung testing looked bad today, and it did improve with the albuterol nebulizer. - We are going to change you to Qvar 80mcg two puffs twice daily instead of Flovent. - There is a copayment card for $15 so I am hopeful that this will work. - We are going to add on the Singulair (montelukast) to help as well, which is generic and should be cheap.   - Daily controller medication(s): Qvar 80mcg Redihaler 2 puffs twice daily - Prior to physical activity: ProAir 2 puffs 10-15 minutes before physical activity. - Rescue medications: ProAir 4 puffs every 4-6 hours as needed - Changes during respiratory infections or worsening symptoms: Increase Qvar 80mcg to 4 puffs twice daily for TWO WEEKS. - Asthma control goals:  * Full participation in all desired activities (may need albuterol before activity) * Albuterol use two time or less a week on average (not counting use with activity) * Cough interfering with sleep two time or less a month * Oral steroids no more than once a year * No hospitalizations  2. Gastroesophageal reflux disease - Continue with omeprazole 20mg  daily.   3. Allergic rhinoconjunctivitis (dust mites, cats) - Continue with Xyzal and you can use up to twice daily if needed. - Continue with Nasacort 1-2 sprays per nostril daily. - Add on Singulair (montelukast) 10mg  daily to see if this can help with your nasal symptoms.  - Singulair can also help with asthma, so it might have a protective benefit for you as well.   4. Return in about 6 months (around 10/09/2018).   Subjective:   Michael Michael Reynolds is a 47 y.o. male presenting today for follow up of  Chief Complaint  Patient presents with  . Cough   since May using albuterol frequent   . Wheezing    Michael Michael Reynolds has a history of the following: Patient Active Problem List   Diagnosis Date Noted  . Mild persistent asthma with acute exacerbation 11/11/2017  . Allergic rhinoconjunctivitis 11/11/2017  . Gastroesophageal reflux disease 11/11/2017    History obtained from: chart review and patient.  Michael PartridgeJohn H Reynolds Michael Reynolds's Primary Care Provider is Michael JamesSun, Vyvyan, MD.     Michael RuizJohn is a 47 y.o. male presenting for a sick visit. He was last seen in January 2019 by Thermon LeylandAnne Ambs. At that time, he was there for a sick visit. At that time, he was started on a prednisone burst. Flovent was initiated (110mcg two puffs BID). He was continued on Xyzal 5mg  daily as needed in addition to Nasacort and Zatidor eye drops. GERD was controlled with the use of omeprazole 20mg  once daily.  He was asked to follow-up in 3 months, but presents now 5 months later.  Since the last visit, he did do well through April when he ran out of the [employer]ovent. He is using a spacer, but only with his albuterol. He was not aware that he needed one for his Flovent. Flovent was costing $200 per month, even with the insurance. He has not needed nay prednisone since the last visit. Typically he needs one course of prednisone per calendar year for breathing problems. Nomar's asthma has been well controlled. He has not required rescue medication, experienced nocturnal awakenings due to lower respiratory symptoms,  nor have activities of daily living been limited. He has required no Emergency Department or Urgent Care visits for his asthma. He has required zero courses of systemic steroids for asthma exacerbations since the last visit. ACT score today is 11, indicating terrible asthma symptom control.   Rhinitis symptoms are somewhat well controlled. He does report marked postnasal drip even with the use of Nasacort and the antihistamines. He has clear discharge which is worse in the morning and  improves over the course of the day. He has not needed antibiotics since the last visit at all. He has never been on montelukast. His last testing was performed in August 2014 and was positive to dust mites and cats.   Otherwise, there have been no changes to his past medical history, surgical history, family history, or social history.    Review of Systems: a 14-point review of systems is pertinent for what is mentioned in HPI.  Otherwise, all other systems were negative. Constitutional: negative other than that listed in the HPI Eyes: negative other than that listed in the HPI Ears, nose, mouth, throat, and face: negative other than that listed in the HPI Respiratory: negative other than that listed in the HPI Cardiovascular: negative other than that listed in the HPI Gastrointestinal: negative other than that listed in the HPI Genitourinary: negative other than that listed in the HPI Integument: negative other than that listed in the HPI Hematologic: negative other than that listed in the HPI Musculoskeletal: negative other than that listed in the HPI Neurological: negative other than that listed in the HPI Allergy/Immunologic: negative other than that listed in the HPI    Objective:   Blood pressure (!) 124/92, pulse 72, resp. rate 20. There is no height or weight on file to calculate BMI.   Physical Exam:  General: Alert, interactive, in no acute distress. Obese male. Talkative.  Eyes: No conjunctival injection bilaterally, no discharge on the right, no discharge on the left and no Horner-Trantas dots present. PERRL bilaterally. EOMI without pain. No photophobia.  Ears: Right TM pearly gray with normal light reflex, Left TM pearly gray with normal light reflex, Right TM intact without perforation and Left TM intact without perforation.  Nose/Throat: External nose within normal limits and septum midline. Turbinates edematous and pale with clear discharge. Posterior oropharynx  erythematous with cobblestoning in the posterior oropharynx. Tonsils 2+ without exudates.  Tongue without thrush. Lungs: Decreased breath sounds bilaterally without wheezing, rhonchi or rales. No increased work of breathing. CV: Normal S1/S2. No murmurs. Capillary refill <2 seconds.  Skin: Warm and dry, without lesions or rashes. Somewhat doughy skin.  Neuro:   Grossly intact. No focal deficits appreciated. Responsive to questions.  Diagnostic studies:   Spirometry: results abnormal (FEV1: 2.22/59%, FVC: 2.83/59%, FEV1/FVC: 78%).    Spirometry consistent with possible restrictive disease. Albuterol/Atrovent nebulizer treatment given in clinic with significant improvement in FVC per ATS criteria.  Allergy Studies: none      Malachi Bonds, MD  [facility]

## 2018-04-09 NOTE — Patient Instructions (Addendum)
1. Mild persistent asthma, uncomplicated - Lung testing looked bad today, and it did improve with the albuterol nebulizer. - We are going to change you to Qvar 80mcg two puffs twice daily instead of Flovent. - There is a copayment card for $15 so I am hopeful that this will work. - We are going to add on the Singulair (montelukast) to help as well, which is generic and should be cheap.   - Daily controller medication(s): Qvar 80mcg Redihaler 2 puffs twice daily - Prior to physical activity: ProAir 2 puffs 10-15 minutes before physical activity. - Rescue medications: ProAir 4 puffs every 4-6 hours as needed - Changes during respiratory infections or worsening symptoms: Increase Qvar 80mcg to 4 puffs twice daily for TWO WEEKS. - Asthma control goals:  * Full participation in all desired activities (may need albuterol before activity) * Albuterol use two time or less a week on average (not counting use with activity) * Cough interfering with sleep two time or less a month * Oral steroids no more than once a year * No hospitalizations  2. Gastroesophageal reflux disease - Continue with omeprazole 20mg  daily.   3. Allergic rhinoconjunctivitis - Continue with Xyzal and you can use up to twice daily if needed. - Continue with Nasacort 1-2 sprays per nostril daily. - Add on Singulair (montelukast) 10mg  daily to see if this can help with your nasal symptoms.  - Singulair can also help with asthma, so it might have a protective benefit for you as well.   4. Return in about 6 months (around 10/09/2018).   Please inform us of any Emergency Department visits, hospitalizations, or changes in symptoms. Call us before going to the ED for breathing or allergy symptoms since we might be able to fit you in for a sick visit. Feel free to contact us anytime with any questions, problems, or concerns.  It was a pleasure to meet you today!  Websites that have reliable patient information: 1. American  Academy of Asthma, Allergy, and Immunology: www.aaaai.org 2. Food Allergy Research and Education (FARE): foodallergy.org 3. Mothers of Asthmatics: http://www.asthmacommunitynetwork.org 4. American College of Allergy, Asthma, and Immunology: [email or web address].cawww.acaai.org   Make sure you are registered to vote!

## 2018-05-05 ENCOUNTER — Other Ambulatory Visit: Payer: Self-pay | Admitting: Family Medicine

## 2018-07-21 DIAGNOSIS — Z0000 Encounter for general adult medical examination without abnormal findings: Secondary | ICD-10-CM | POA: Diagnosis not present

## 2018-07-21 DIAGNOSIS — E782 Mixed hyperlipidemia: Secondary | ICD-10-CM | POA: Diagnosis not present

## 2018-07-21 DIAGNOSIS — I10 Essential (primary) hypertension: Secondary | ICD-10-CM | POA: Diagnosis not present

## 2018-07-21 DIAGNOSIS — Z125 Encounter for screening for malignant neoplasm of prostate: Secondary | ICD-10-CM | POA: Diagnosis not present

## 2018-07-21 DIAGNOSIS — Z23 Encounter for immunization: Secondary | ICD-10-CM | POA: Diagnosis not present

## 2019-01-19 DIAGNOSIS — K219 Gastro-esophageal reflux disease without esophagitis: Secondary | ICD-10-CM | POA: Diagnosis not present

## 2019-01-19 DIAGNOSIS — I10 Essential (primary) hypertension: Secondary | ICD-10-CM | POA: Diagnosis not present

## 2019-01-19 DIAGNOSIS — E782 Mixed hyperlipidemia: Secondary | ICD-10-CM | POA: Diagnosis not present

## 2019-01-19 DIAGNOSIS — R600 Localized edema: Secondary | ICD-10-CM | POA: Diagnosis not present

## 2019-03-06 ENCOUNTER — Other Ambulatory Visit: Payer: Self-pay

## 2019-03-06 MED ORDER — ALBUTEROL SULFATE HFA 108 (90 BASE) MCG/ACT IN AERS: 2.0000 | INHALATION_SPRAY | RESPIRATORY_TRACT | 0 refills | Status: DC | PRN

## 2019-03-06 NOTE — Telephone Encounter (Signed)
Courtesy refill until patient is seen in office.

## 2019-04-27 ENCOUNTER — Other Ambulatory Visit: Payer: Self-pay | Admitting: *Deleted

## 2019-04-28 ENCOUNTER — Other Ambulatory Visit: Payer: Self-pay | Admitting: *Deleted

## 2019-04-28 NOTE — Telephone Encounter (Signed)
Refill request for Qvar Redihaler 80 mcg refused yesterday will fax denial to [place] [phone]

## 2019-05-05 ENCOUNTER — Other Ambulatory Visit: Payer: Self-pay

## 2019-05-05 ENCOUNTER — Encounter: Payer: Self-pay | Admitting: Allergy & Immunology

## 2019-05-05 ENCOUNTER — Ambulatory Visit: Payer: BC Managed Care – PPO | Admitting: Allergy & Immunology

## 2019-05-05 VITALS — BP 102/70 | HR 94 | Temp 98.2°F | Resp 18 | Ht 68.0 in | Wt 277.0 lb

## 2019-05-05 DIAGNOSIS — J4531 Mild persistent asthma with (acute) exacerbation: Secondary | ICD-10-CM

## 2019-05-05 DIAGNOSIS — H1010 Acute atopic conjunctivitis, unspecified eye: Secondary | ICD-10-CM

## 2019-05-05 DIAGNOSIS — K219 Gastro-esophageal reflux disease without esophagitis: Secondary | ICD-10-CM | POA: Diagnosis not present

## 2019-05-05 DIAGNOSIS — J309 Allergic rhinitis, unspecified: Secondary | ICD-10-CM | POA: Diagnosis not present

## 2019-05-05 MED ORDER — ALBUTEROL SULFATE HFA 108 (90 BASE) MCG/ACT IN AERS: 2.0000 | INHALATION_SPRAY | RESPIRATORY_TRACT | 0 refills | Status: DC | PRN

## 2019-05-05 MED ORDER — QVAR REDIHALER 80 MCG/ACT IN AERB: 2.0000 | INHALATION_SPRAY | Freq: Two times a day (BID) | RESPIRATORY_TRACT | 5 refills | Status: DC

## 2019-05-05 NOTE — Patient Instructions (Addendum)
1. Mild persistent asthma, uncomplicated - Lung testing looked low today, but it did improve with the albuterol treatment. - Hopefully you are headed the right direction, but start the steroid pack provided if you continue to have problems with coughing/shortness of breath.  - I think you are on the mend, but I would remain on the Qvar twice daily from here on out. - Copay card provided (call us if it is too expensive and we will try to send in something else). - Daily controller medication(s): Qvar 23mcg Redihaler 2 puffs twice daily - Prior to physical activity: ProAir 2 puffs 10-15 minutes before physical activity. - Rescue medications: ProAir 4 puffs every 4-6 hours as needed - Changes during respiratory infections or worsening symptoms: Increase Qvar 30mcg to 4 puffs twice daily for TWO WEEKS. - Asthma control goals:  * Full participation in all desired activities (may need albuterol before activity) * Albuterol use two time or less a week on average (not counting use with activity) * Cough interfering with sleep two time or less a month * Oral steroids no more than once a year * No hospitalizations  2. Gastroesophageal reflux disease - Continue with omeprazole 20mg  daily.   3. Allergic rhinoconjunctivitis - Continue with Xyzal and you can use up to twice daily if needed. - Continue with Nasacort 1-2 sprays per nostril daily.  4. Return in about 6 months (around 11/05/2019). This can be an in-person, a virtual Webex or a telephone follow up visit.   Please inform us of any Emergency Department visits, hospitalizations, or changes in symptoms. Call us before going to the ED for breathing or allergy symptoms since we might be able to fit you in for a sick visit. Feel free to contact us anytime with any questions, problems, or concerns.  It was a pleasure to see you again today!  Websites that have reliable patient information: 1. American Academy of Asthma, Allergy, and Immunology:  www.aaaai.org 2. Food Allergy Research and Education (FARE): foodallergy.org 3. Mothers of Asthmatics: http://www.asthmacommunitynetwork.org 4. American College of Allergy, Asthma, and Immunology: www.acaai.org  "Like" [place] on Facebook and Instagram for our latest updates!      Make sure you are registered to vote! If you have moved or changed any of your contact information, you will need to get this updated before voting!  In some cases, you MAY be able to register to vote online: [email or web address]    Voter ID laws are NOT going into effect for the General Election in November 2020! DO NOT let this stop you from exercising your right to vote!   Absentee voting is the SAFEST way to vote during the coronavirus pandemic!   Download and print an absentee ballot request form at [email or web address] or you can scan the QR code below with your smart phone:      More information on absentee ballots can be found here: [email or web address]

## 2019-05-05 NOTE — Progress Notes (Signed)
FOLLOW UP  Date of Service/Encounter:  05/05/19   Assessment:   Mild persistent asthma with acute exacerbation   Gastroesophageal reflux disease  Allergic rhinoconjunctivitis  Plan/Recommendations:   1. Mild persistent asthma, uncomplicated - Lung testing looked low today, but it did improve with the albuterol treatment. - Hopefully you are headed the right direction, but start the steroid pack provided if you continue to have problems with coughing/shortness of breath.  - I think you are on the mend, but I would remain on the Qvar twice daily from here on out. - Copay card provided (call us if it is too expensive and we will try to send in something else). - Daily controller medication(s): Qvar 81mcg Redihaler 2 puffs twice daily - Prior to physical activity: ProAir 2 puffs 10-15 minutes before physical activity. - Rescue medications: ProAir 4 puffs every 4-6 hours as needed - Changes during respiratory infections or worsening symptoms: Increase Qvar 81mcg to 4 puffs twice daily for TWO WEEKS. - Asthma control goals:  * Full participation in all desired activities (may need albuterol before activity) * Albuterol use two time or less a week on average (not counting use with activity) * Cough interfering with sleep two time or less a month * Oral steroids no more than once a year * No hospitalizations  2. Gastroesophageal reflux disease - Continue with omeprazole 20mg  daily.   3. Allergic rhinoconjunctivitis - Continue with Xyzal and you can use up to twice daily if needed. - Continue with Nasacort 1-2 sprays per nostril daily.  4. Return in about 6 months (around 11/05/2019). This can be an in-person, a virtual Webex or a telephone follow up visit.   Subjective:   Michael Reynolds is a 48 y.o. male presenting today for follow up of  Chief Complaint  Patient presents with  . Asthma    he did have some issues about 3 weeks ago when he had to push mow his yard. he did  develop some coughing and shob. he is doing much better now though.     Michael Reynolds has a history of the following: Patient Active Problem List   Diagnosis Date Noted  . Mild persistent asthma with acute exacerbation 11/11/2017  . Allergic rhinoconjunctivitis 11/11/2017  . Gastroesophageal reflux disease 11/11/2017    History obtained from: chart review and patient.  Michael Reynolds is a 48 y.o. male presenting for a follow up visit.  He was last seen in June 2019.  At that time, his lung testing looked terrible, but it did improve with the albuterol nebulizer treatment.  We changed him from Flovent to Qvar 80 mcg 2 puffs twice daily since we are hopeful that it would be cheaper for him.  We also added on Singulair since this was available generic.  We continued omeprazole 20 mg daily for his reflux.  We also continued Xyzal and Nasacort.   Since the last visit, he has mostly done well. He did have to push mow his yard the other day and had a mild asthma flare. He was previously taking it once in the morning but he doubled up on that for a few weeks. He continues to do it twice daily now but he is out of the medication. He has not needed prednisone since he saw me last time. Overall he is doing well with the Qvar and the albuterol. He did have access to his niece's nebulizer machine and this worked well for him.  He remains on the Xyzal and the Nasacort.  He purchases both over-the-counter.  For whatever reason, he never continue with the Singulair.  It is not clear why, but with asking more questions it seems that it never really helped much anyway.  He has not needed antibiotics since last visit.  He has not been using his omeprazole routinely, but he is going to add it back on since his breathing seems to get worse at night.  He does not need a refill on this today.  Otherwise, there have been no changes to his past medical history, surgical history, family history, or social history.  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    Review of Systems  Constitutional: Negative.  Negative for fever, malaise/fatigue and weight loss.  HENT: Negative.  Negative for congestion, ear discharge and ear pain.   Eyes: Negative for pain, discharge and redness.  Respiratory: Positive for cough and shortness of breath. Negative for sputum production and wheezing.   Cardiovascular: Negative.  Negative for chest pain and palpitations.  Gastrointestinal: Negative for abdominal pain, heartburn, nausea and vomiting.  Skin: Negative.  Negative for itching and rash.  Neurological: Negative for dizziness and headaches.  Endo/Heme/Allergies: Negative for environmental allergies. Does not bruise/bleed easily.       Objective:   Blood pressure 102/70, pulse 94, temperature 98.2 F (36.8 C), temperature source Temporal, resp. rate 18, height 5\' 8"  (1.727 m), weight 277 lb (125.6 kg), SpO2 96 %. Body mass index is 42.12 kg/m.   Physical Exam:  Physical Exam  Constitutional: He appears well-developed.  Obese male.  HENT:  Head: Normocephalic and atraumatic.  Right Ear: Tympanic membrane, external ear and ear canal normal.  Left Ear: Tympanic membrane, external ear and ear canal normal.  Nose: Mucosal edema and rhinorrhea present. No nasal deformity or septal deviation. No epistaxis. Right sinus exhibits no maxillary sinus tenderness and no frontal sinus tenderness. Left sinus exhibits no maxillary sinus tenderness and no frontal sinus tenderness.  Mouth/Throat: Uvula is midline and oropharynx is clear and moist. Mucous membranes are not pale and not dry.  Eyes: Pupils are equal, round, and reactive to light. Conjunctivae and EOM are normal. Right eye exhibits no chemosis and no discharge. Left eye exhibits no chemosis and no discharge. Right conjunctiva is not injected. Left conjunctiva is not injected.  Cardiovascular: Normal rate, regular rhythm and normal heart  sounds.  Respiratory: Effort normal and breath sounds normal. No accessory muscle usage. No tachypnea. No respiratory distress. He has no wheezes. He has no rhonchi. He has no rales. He exhibits no tenderness.  Decreased air movement at the bases.  Lymphadenopathy:    He has no cervical adenopathy.  Neurological: He is alert.  Skin: No abrasion, no petechiae and no rash noted. Rash is not papular, not vesicular and not urticarial. No erythema. No pallor.  No eczematous or urticarial lesions noted.  Psychiatric: He has a normal mood and affect.     Diagnostic studies:    Spirometry: results abnormal (FEV1: 2.47/66%, FVC: 3.17/66%, FEV1/FVC: 78%).    Spirometry consistent with possible restrictive disease. Albuterol MDI 4 puffs  treatment given in clinic with improvement in FEV1 and FVC, but not significant per ATS criteria.  Allergy Studies: none        Malachi BondsJoel Zhane Donlan, MD  [facility]

## 2019-07-02 ENCOUNTER — Other Ambulatory Visit: Payer: Self-pay | Admitting: Allergy & Immunology

## 2019-07-24 DIAGNOSIS — E782 Mixed hyperlipidemia: Secondary | ICD-10-CM | POA: Diagnosis not present

## 2019-07-24 DIAGNOSIS — Z23 Encounter for immunization: Secondary | ICD-10-CM | POA: Diagnosis not present

## 2019-07-24 DIAGNOSIS — I10 Essential (primary) hypertension: Secondary | ICD-10-CM | POA: Diagnosis not present

## 2019-07-24 DIAGNOSIS — R600 Localized edema: Secondary | ICD-10-CM | POA: Diagnosis not present

## 2019-07-24 DIAGNOSIS — Z0000 Encounter for general adult medical examination without abnormal findings: Secondary | ICD-10-CM | POA: Diagnosis not present

## 2019-07-24 DIAGNOSIS — Z125 Encounter for screening for malignant neoplasm of prostate: Secondary | ICD-10-CM | POA: Diagnosis not present

## 2019-09-17 DIAGNOSIS — E7800 Pure hypercholesterolemia, unspecified: Secondary | ICD-10-CM | POA: Diagnosis not present

## 2019-12-16 ENCOUNTER — Other Ambulatory Visit: Payer: Self-pay | Admitting: Allergy & Immunology

## 2020-01-21 ENCOUNTER — Ambulatory Visit: Payer: Self-pay | Attending: Internal Medicine

## 2020-01-21 DIAGNOSIS — Z23 Encounter for immunization: Secondary | ICD-10-CM

## 2020-01-21 NOTE — Progress Notes (Signed)
   Covid-19 Vaccination Clinic  Name:  ARKEEM HARTS    MRN: [id number] DOB: Jun 05, 1971  01/21/2020  Mr. Jent was observed post Covid-19 immunization for 15 minutes without incident. He was provided with Vaccine Information Sheet and instruction to access the V-Safe system.   Mr. Montini was instructed to call 911 with any severe reactions post vaccine: Marland Kitchen Difficulty breathing  . Swelling of face and throat  . A fast heartbeat  . A bad rash all over body  . Dizziness and weakness   Immunizations Administered    Name Date Dose VIS Date Route   Pfizer COVID-19 Vaccine 01/21/2020  8:51 AM 0.3 mL 10/09/2019 Intramuscular   Manufacturer: [employer], [employer]   Lot: [id number]   NDC: [address]-0

## 2020-01-22 DIAGNOSIS — E782 Mixed hyperlipidemia: Secondary | ICD-10-CM | POA: Diagnosis not present

## 2020-01-22 DIAGNOSIS — R600 Localized edema: Secondary | ICD-10-CM | POA: Diagnosis not present

## 2020-01-22 DIAGNOSIS — I10 Essential (primary) hypertension: Secondary | ICD-10-CM | POA: Diagnosis not present

## 2020-01-22 DIAGNOSIS — E890 Postprocedural hypothyroidism: Secondary | ICD-10-CM | POA: Diagnosis not present

## 2020-02-03 ENCOUNTER — Other Ambulatory Visit: Payer: Self-pay | Admitting: Allergy & Immunology

## 2020-02-15 ENCOUNTER — Ambulatory Visit: Payer: Self-pay

## 2020-02-17 ENCOUNTER — Ambulatory Visit: Payer: Self-pay | Attending: Internal Medicine

## 2020-02-17 DIAGNOSIS — Z23 Encounter for immunization: Secondary | ICD-10-CM

## 2020-02-17 NOTE — Progress Notes (Signed)
   Covid-19 Vaccination Clinic  Name:  Michael Reynolds    MRN: [id number] DOB: [birth year]  02/17/2020  Michael Reynolds was observed post Covid-19 immunization for 15 minutes without incident. He was provided with Vaccine Information Sheet and instruction to access the V-Safe system.   Michael Reynolds was instructed to call 911 with any severe reactions post vaccine: Marland Kitchen Difficulty breathing  . Swelling of face and throat  . A fast heartbeat  . A bad rash all over body  . Dizziness and weakness   Immunizations Administered    Name Date Dose VIS Date Route   Pfizer COVID-19 Vaccine 02/17/2020  8:48 AM 0.3 mL 12/23/2018 Intramuscular   Manufacturer: [employer], [employer]   Lot: [id number]   NDC: [address]-2

## 2020-04-12 ENCOUNTER — Ambulatory Visit: Payer: BC Managed Care – PPO | Admitting: Allergy & Immunology

## 2020-04-12 ENCOUNTER — Encounter: Payer: Self-pay | Admitting: Allergy & Immunology

## 2020-04-12 ENCOUNTER — Other Ambulatory Visit: Payer: Self-pay

## 2020-04-12 VITALS — BP 128/82 | HR 86 | Temp 98.2°F | Resp 16 | Ht 68.0 in | Wt 279.2 lb

## 2020-04-12 DIAGNOSIS — J302 Other seasonal allergic rhinitis: Secondary | ICD-10-CM

## 2020-04-12 DIAGNOSIS — J3089 Other allergic rhinitis: Secondary | ICD-10-CM

## 2020-04-12 DIAGNOSIS — J4540 Moderate persistent asthma, uncomplicated: Secondary | ICD-10-CM | POA: Diagnosis not present

## 2020-04-12 NOTE — Patient Instructions (Addendum)
1. Mild persistent asthma, uncomplicated - Lung testing looked low today, but it did improve with the albuterol treatment. - Stop the Qvar and start AirDuo 113/39mcg one puff twice daily. - Copay card provided (CALL us if it is too expensive and we will try to send in something else). - Daily controller medication(s): AirDuo 113/23mcg one puff twice daily - Prior to physical activity: ProAir 2 puffs 10-15 minutes before physical activity. - Rescue medications: ProAir 4 puffs every 4-6 hours as needed - Asthma control goals:  * Full participation in all desired activities (may need albuterol before activity) * Albuterol use two time or less a week on average (not counting use with activity) * Cough interfering with sleep two time or less a month * Oral steroids no more than once a year * No hospitalizations  2. Gastroesophageal reflux disease - Continue with omeprazole 20mg  daily.   3. Allergic rhinoconjunctivitis - Continue with Xyzal and you can use up to twice daily if needed. - Continue with Nasacort 1-2 sprays per nostril daily.  4. Return in about 3 months (around 07/13/2020). This can be an in-person, a virtual Webex or a telephone follow up visit.   Please inform 07/15/2020 of any Emergency Department visits, hospitalizations, or changes in symptoms. Call us before going to the ED for breathing or allergy symptoms since we might be able to fit you in for a sick visit. Feel free to contact us anytime with any questions, problems, or concerns.  It was a pleasure to see you again today!  Websites that have reliable patient information: 1. American Academy of Asthma, Allergy, and Immunology: www.aaaai.org 2. Food Allergy Research and Education (FARE): foodallergy.org 3. Mothers of Asthmatics: http://www.asthmacommunitynetwork.org 4. American College of Allergy, Asthma, and Immunology: www.acaai.org   COVID-19 Vaccine Information can be found at:  [place] For questions related to vaccine distribution or appointments, please email [email or web address] or call [phone].     "Like" [phone] on Facebook and Instagram for our latest updates!        Make sure you are registered to vote! If you have moved or changed any of your contact information, you will need to get this updated before voting!  In some cases, you MAY be able to register to vote online: [place]

## 2020-04-12 NOTE — Progress Notes (Signed)
FOLLOW UP  Date of Service/Encounter:     Assessment:   Moderate persistent asthma, uncomplicated  Gastroesophageal reflux disease  Allergic rhinoconjunctivitis  Plan/Recommendations:   1. Mild persistent asthma, uncomplicated - Lung testing looked low today, but it did improve with the albuterol treatment. - Stop the Qvar and start AirDuo 113/28mcg one puff twice daily. - Copay card provided (CALL us if it is too expensive and we will try to send in something else). - Daily controller medication(s): AirDuo 113/62mcg one puff twice daily - Prior to physical activity: ProAir 2 puffs 10-15 minutes before physical activity. - Rescue medications: ProAir 4 puffs every 4-6 hours as needed - Asthma control goals:  * Full participation in all desired activities (may need albuterol before activity) * Albuterol use two time or less a week on average (not counting use with activity) * Cough interfering with sleep two time or less a month * Oral steroids no more than once a year * No hospitalizations  2. Gastroesophageal reflux disease - Continue with omeprazole 20mg  daily.   3. Allergic rhinoconjunctivitis - Continue with Xyzal and you can use up to twice daily if needed. - Continue with Nasacort 1-2 sprays per nostril daily.  4. Return in about 3 months (around 07/13/2020). This can be an in-person, a virtual Webex or a telephone follow up visit.   Subjective:   Michael Reynolds is a 49 y.o. male presenting today for follow up of  Chief Complaint  Patient presents with  . Asthma    Michael Reynolds has a history of the following: Patient Active Problem List   Diagnosis Date Noted  . Moderate persistent asthma, uncomplicated 62/70/3500  . Seasonal and perennial allergic rhinitis 04/13/2020  . Gastroesophageal reflux disease 11/11/2017    History obtained from: chart review and patient.  Michael Reynolds is a 49 y.o. male presenting for a follow up visit. He was last seen  in July 2020. At that time, we ended up treating him for an asthma exacerbation.  We started him on prednisone.  We also recommended that he remain on the Qvar twice daily every day.  We did give him a co-pay card.  We continued with albuterol as needed.  We also continue with omeprazole for his reflux and Xyzal and Nasacort for his allergic rhinoconjunctivitis.  Since last visit, he reports he has done well.  Asthma/Respiratory Symptom History: He remains on the Qvar 2 puffs twice daily.  However, during the visit, it comes out but he actually probably uses it less than that because of the price of it.  He does pain around $100 per refill of this.  He has not needed any steroids for his symptoms.  He has not needed any emergency room visits.  He does not report good sleep, however.  He does snore at night and does not wake up refreshed.  Allergic Rhinitis Symptom History: He remains on the Xyzal as well as Nasacort.  He does not use either on a routine basis.   He has not needed antibiotics since last visit.  GERD Symptom History: He remains on the omeprazole.  He has had no worsening symptoms of his reflux.  He does note some dietary changes he can make to improve the symptoms.  He has a history of a rhinophyma.  He is followed by Dr. Jerrell Belfast as well as Dr. Mikki Santee.  He underwent surgery in September 2018 for this.  His last visit with Dr. Cherlyn Cushing  was in November 2018.  Otherwise, there have been no changes to his past medical history, surgical history, family history, or social history.    Review of Systems  Constitutional: Negative.  Negative for chills, fever, malaise/fatigue and weight loss.  HENT: Positive for congestion. Negative for ear discharge, ear pain and sinus pain.        Positive for snoring.  Positive for postnasal drip.  Eyes: Negative for pain, discharge and redness.  Respiratory: Positive for cough and shortness of breath. Negative for sputum production and  wheezing.   Cardiovascular: Negative.  Negative for chest pain and palpitations.  Gastrointestinal: Negative for abdominal pain, constipation, diarrhea, heartburn, nausea and vomiting.  Skin: Negative.  Negative for itching and rash.  Neurological: Negative for dizziness and headaches.  Endo/Heme/Allergies: Positive for environmental allergies. Does not bruise/bleed easily.       Objective:   Blood pressure 128/82, pulse 86, temperature 98.2 F (36.8 C), temperature source Temporal, resp. rate 16, height 5\' 8"  (1.727 m), weight 279 lb 3.2 oz (126.6 kg), SpO2 96 %. Body mass index is 42.45 kg/m.   Physical Exam:  Physical Exam  Constitutional: He appears well-developed.  Obese male.  Cooperative with exam.  Smiling.  HENT:  Head: Normocephalic and atraumatic.  Right Ear: Tympanic membrane, external ear and ear canal normal.  Left Ear: Tympanic membrane and ear canal normal.  Nose: No mucosal edema, rhinorrhea, nasal deformity or septal deviation. Right sinus exhibits no maxillary sinus tenderness and no frontal sinus tenderness. Left sinus exhibits no maxillary sinus tenderness and no frontal sinus tenderness.  Mouth/Throat: Uvula is midline. Mucous membranes are not pale and not dry.  Eyes: Pupils are equal, round, and reactive to light. Conjunctivae are normal. Right eye exhibits no chemosis and no discharge. Left eye exhibits no chemosis and no discharge. Right conjunctiva is not injected. Left conjunctiva is not injected.  Cardiovascular: Normal rate, regular rhythm and normal heart sounds.  Respiratory: Effort normal and breath sounds normal. No accessory muscle usage. No tachypnea. No respiratory distress. He has no wheezes. He has no rhonchi. He has no rales. He exhibits no tenderness.  Lymphadenopathy:    He has no cervical adenopathy.  Neurological: He is alert.  Skin: No abrasion, no petechiae and no rash noted. Rash is not papular, not vesicular and not urticarial. No  erythema. No pallor.     Diagnostic studies:    Spirometry:     Allergy Studies: none       , MD  [facility]

## 2020-04-13 ENCOUNTER — Encounter: Payer: Self-pay | Admitting: Allergy & Immunology

## 2020-04-13 ENCOUNTER — Telehealth: Payer: Self-pay | Admitting: Allergy & Immunology

## 2020-04-13 DIAGNOSIS — J4540 Moderate persistent asthma, uncomplicated: Secondary | ICD-10-CM | POA: Insufficient documentation

## 2020-04-13 DIAGNOSIS — J302 Other seasonal allergic rhinitis: Secondary | ICD-10-CM | POA: Insufficient documentation

## 2020-04-13 DIAGNOSIS — J3089 Other allergic rhinitis: Secondary | ICD-10-CM | POA: Insufficient documentation

## 2020-04-13 MED ORDER — AIRDUO DIGIHALER 113-14 MCG/ACT IN AEPB: 1.0000 | INHALATION_SPRAY | Freq: Two times a day (BID) | RESPIRATORY_TRACT | 5 refills | Status: DC

## 2020-04-13 MED ORDER — ALBUTEROL SULFATE HFA 108 (90 BASE) MCG/ACT IN AERS: 2.0000 | INHALATION_SPRAY | RESPIRATORY_TRACT | 1 refills | Status: DC | PRN

## 2020-04-13 NOTE — Telephone Encounter (Signed)
Unable to reach patient. Left voicemail to inform patient that medications were sent in to his pharmacy

## 2020-04-13 NOTE — Telephone Encounter (Signed)
Patient called and no meds was called in yesterday. Proiar, and airduo. Into [facility] .[id number].

## 2020-07-19 ENCOUNTER — Ambulatory Visit: Payer: BC Managed Care – PPO | Admitting: Allergy & Immunology

## 2020-07-19 ENCOUNTER — Other Ambulatory Visit: Payer: Self-pay

## 2020-07-19 ENCOUNTER — Encounter: Payer: Self-pay | Admitting: Allergy & Immunology

## 2020-07-19 VITALS — BP 108/62 | HR 92 | Resp 18

## 2020-07-19 DIAGNOSIS — J302 Other seasonal allergic rhinitis: Secondary | ICD-10-CM | POA: Diagnosis not present

## 2020-07-19 DIAGNOSIS — J4540 Moderate persistent asthma, uncomplicated: Secondary | ICD-10-CM | POA: Diagnosis not present

## 2020-07-19 DIAGNOSIS — J3089 Other allergic rhinitis: Secondary | ICD-10-CM

## 2020-07-19 NOTE — Progress Notes (Signed)
FOLLOW UP  Date of Service/Encounter:  07/19/20   Assessment:   Moderate persistent asthma, uncomplicated  Gastroesophageal reflux disease  Allergic rhinoconjunctivitis  Plan/Recommendations:   1. Mild persistent asthma, uncomplicated - Lung testing looked much better today. - I think that the change from Qvar to AirDuo has improved your breathing status.  - We will continue with that for now.  - Daily controller medication(s): AirDuo 113/58mcg one puff twice daily - Prior to physical activity: ProAir 2 puffs 10-15 minutes before physical activity. - Rescue medications: ProAir 4 puffs every 4-6 hours as needed - Asthma control goals:  * Full participation in all desired activities (may need albuterol before activity) * Albuterol use two time or less a week on average (not counting use with activity) * Cough interfering with sleep two time or less a month * Oral steroids no more than once a year * No hospitalizations  2. Gastroesophageal reflux disease - Continue with omeprazole 20mg  daily.    3. Allergic rhinoconjunctivitis - Continue with Xyzal and you can use up to twice daily if needed. - Continue with Nasacort 1-2 sprays per nostril daily when things get particularly well.   4. Return in about 6 months (around 01/16/2021).   Subjective:   Michael Reynolds is a 49 y.o. male presenting today for follow up of  Chief Complaint  Patient presents with  . Asthma    doing well. no issues. no steroids or antibiotics.     [name] has a history of the following: Patient Active Problem List   Diagnosis Date Noted  . Moderate persistent asthma, uncomplicated 04/13/2020  . Seasonal and perennial allergic rhinitis 04/13/2020  . Gastroesophageal reflux disease 11/11/2017    History obtained from: chart review and patient.  Michael Reynolds is a 49 y.o. male presenting for a follow up visit. He was last seen in June 2021. At that time, lung testing looked low but  it improved with the albuterol treatment.  We changed him from Qvar to AirDuo 113/60mcg one puff twice daily.  For his reflux, we continued with omeprazole 20 mg daily.  His allergic rhinoconjunctivitis was controlled with Xyzal as well as Nasacort.  Since last visit, he reports that he is doing well.  He actually recently went to [employer]  He drove since he is still nervous about flying.  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  Asthma/Respiratory Symptom History: He is using the air duo 1 puff twice daily.  He thinks that this is decreased the use of his albuterol inhaler. Michael Reynolds's asthma has been well controlled. He has not required rescue medication, experienced nocturnal awakenings due to lower respiratory symptoms, nor have activities of daily living been limited. He has required no Emergency Department or Urgent Care visits for his asthma. He has required zero courses of systemic steroids for asthma exacerbations since the last visit. ACT score today is 25, indicating excellent asthma symptom control.   Allergic Rhinitis Symptom History: He remains on the Xyzal daily. He does not use the Nasacort as much. He uses the Xyzal which keeps a lot of his symptoms at back. He has not needed antibiotics at all since the last visit. Overall he feels that he is doing very well.   Otherwise, there have been no changes to his past medical history, surgical history, family history, or social history.    Review of Systems  Constitutional: Negative.  Negative for chills, fever, malaise/fatigue  and weight loss.  HENT: Negative.  Negative for congestion, ear discharge, ear pain, sinus pain and sore throat.   Eyes: Negative for pain, discharge and redness.  Respiratory: Negative for cough, sputum production, shortness of breath and wheezing.   Cardiovascular: Negative.  Negative for chest pain and palpitations.  Gastrointestinal: Negative for abdominal pain,  constipation, diarrhea, heartburn, nausea and vomiting.  Skin: Negative.  Negative for itching and rash.  Neurological: Negative for dizziness and headaches.  Endo/Heme/Allergies: Negative for environmental allergies. Does not bruise/bleed easily.       Objective:   Blood pressure 108/62, pulse 92, resp. rate 18, SpO2 97 %. There is no height or weight on file to calculate BMI.   Physical Exam:  Physical Exam Vitals reviewed.  Constitutional:      Appearance: He is well-developed. He is obese.  HENT:     Head: Normocephalic and atraumatic.     Right Ear: Tympanic membrane, ear canal and external ear normal.     Left Ear: Tympanic membrane, ear canal and external ear normal.     Nose: Nasal deformity present. No septal deviation, mucosal edema or rhinorrhea.     Right Sinus: No maxillary sinus tenderness or frontal sinus tenderness.     Left Sinus: No maxillary sinus tenderness or frontal sinus tenderness.     Mouth/Throat:     Mouth: Mucous membranes are not pale and not dry.     Pharynx: Uvula midline.  Eyes:     General: Allergic shiner present.        Right eye: No discharge.        Left eye: No discharge.     Conjunctiva/sclera: Conjunctivae normal.     Right eye: Right conjunctiva is not injected. No chemosis.    Left eye: Left conjunctiva is not injected. No chemosis.    Pupils: Pupils are equal, round, and reactive to light.  Cardiovascular:     Rate and Rhythm: Normal rate and regular rhythm.     Heart sounds: Normal heart sounds.  Pulmonary:     Effort: Pulmonary effort is normal. No tachypnea, accessory muscle usage or respiratory distress.     Breath sounds: Normal breath sounds. No wheezing, rhonchi or rales.     Comments: Moving air well in all lung fields.  Chest:     Chest wall: No tenderness.  Lymphadenopathy:     Cervical: No cervical adenopathy.  Skin:    Coloration: Skin is not pale.     Findings: No abrasion, erythema, petechiae or rash. Rash is  not papular, urticarial or vesicular.  Neurological:     Mental Status: He is alert.  Psychiatric:        Behavior: Behavior is cooperative.      Diagnostic studies:    Spirometry: results normal (FEV1: 3.10/83%, FVC: 3.80/80%, FEV1/FVC: 82%).    Spirometry consistent with normal pattern.  Overall, the spirometry is much improved compared to the last one.  Allergy Studies: none       Malachi Bonds, MD  [facility]

## 2020-07-19 NOTE — Patient Instructions (Addendum)
1. Mild persistent asthma, uncomplicated - Lung testing looked much better today. - I think that the change from Qvar to AirDuo has improved your breathing status.  - We will continue with that for now.  - Daily controller medication(s): AirDuo 113/44mcg one puff twice daily - Prior to physical activity: ProAir 2 puffs 10-15 minutes before physical activity. - Rescue medications: ProAir 4 puffs every 4-6 hours as needed - Asthma control goals:  * Full participation in all desired activities (may need albuterol before activity) * Albuterol use two time or less a week on average (not counting use with activity) * Cough interfering with sleep two time or less a month * Oral steroids no more than once a year * No hospitalizations  2. Gastroesophageal reflux disease - Continue with omeprazole 20mg  daily.    3. Allergic rhinoconjunctivitis - Continue with Xyzal and you can use up to twice daily if needed. - Continue with Nasacort 1-2 sprays per nostril daily when things get particularly well.   4. Return in about 6 months (around 01/16/2021).    Please inform 01/18/2021 of any Emergency Department visits, hospitalizations, or changes in symptoms. Call us before going to the ED for breathing or allergy symptoms since we might be able to fit you in for a sick visit. Feel free to contact us anytime with any questions, problems, or concerns.  It was a pleasure to see you again today! Congrats on the weight loss! It was good to see you, [place]!   Websites that have reliable patient information: 1. American Academy of Asthma, Allergy, and Immunology: www.aaaai.org 2. Food Allergy Research and Education (FARE): foodallergy.org 3. Mothers of Asthmatics: http://www.asthmacommunitynetwork.org 4. American College of Allergy, Asthma, and Immunology: www.acaai.org   COVID-19 Vaccine Information can be found at: Renato Gails For questions related  to vaccine distribution or appointments, please email [email or web address] or call [phone].     "Like" [phone] on Facebook and Instagram for our latest updates!      Make sure you are registered to vote! If you have moved or changed any of your contact information, you will need to get this updated before voting!  In some cases, you MAY be able to register to vote online: [place]

## 2020-07-20 ENCOUNTER — Encounter: Payer: Self-pay | Admitting: Allergy & Immunology

## 2020-07-26 DIAGNOSIS — R600 Localized edema: Secondary | ICD-10-CM | POA: Diagnosis not present

## 2020-07-26 DIAGNOSIS — K219 Gastro-esophageal reflux disease without esophagitis: Secondary | ICD-10-CM | POA: Diagnosis not present

## 2020-07-26 DIAGNOSIS — Z23 Encounter for immunization: Secondary | ICD-10-CM | POA: Diagnosis not present

## 2020-07-26 DIAGNOSIS — I10 Essential (primary) hypertension: Secondary | ICD-10-CM | POA: Diagnosis not present

## 2020-07-26 DIAGNOSIS — Z0000 Encounter for general adult medical examination without abnormal findings: Secondary | ICD-10-CM | POA: Diagnosis not present

## 2020-07-26 DIAGNOSIS — Z125 Encounter for screening for malignant neoplasm of prostate: Secondary | ICD-10-CM | POA: Diagnosis not present

## 2020-07-26 DIAGNOSIS — E782 Mixed hyperlipidemia: Secondary | ICD-10-CM | POA: Diagnosis not present

## 2020-12-27 ENCOUNTER — Other Ambulatory Visit: Payer: Self-pay | Admitting: Allergy & Immunology

## 2021-01-17 ENCOUNTER — Other Ambulatory Visit: Payer: Self-pay

## 2021-01-17 ENCOUNTER — Encounter: Payer: Self-pay | Admitting: Allergy & Immunology

## 2021-01-17 ENCOUNTER — Ambulatory Visit: Payer: BC Managed Care – PPO | Admitting: Allergy & Immunology

## 2021-01-17 VITALS — BP 124/78 | HR 98 | Temp 98.6°F | Resp 18 | Ht 69.0 in | Wt 272.0 lb

## 2021-01-17 DIAGNOSIS — J302 Other seasonal allergic rhinitis: Secondary | ICD-10-CM

## 2021-01-17 DIAGNOSIS — K219 Gastro-esophageal reflux disease without esophagitis: Secondary | ICD-10-CM

## 2021-01-17 DIAGNOSIS — J3089 Other allergic rhinitis: Secondary | ICD-10-CM

## 2021-01-17 DIAGNOSIS — J4540 Moderate persistent asthma, uncomplicated: Secondary | ICD-10-CM

## 2021-01-17 MED ORDER — AIRDUO DIGIHALER 113-14 MCG/ACT IN AEPB: 1.0000 | INHALATION_SPRAY | Freq: Two times a day (BID) | RESPIRATORY_TRACT | 5 refills | Status: DC

## 2021-01-17 MED ORDER — LEVOCETIRIZINE DIHYDROCHLORIDE 5 MG PO TABS: 5.0000 mg | ORAL_TABLET | Freq: Every evening | ORAL | 5 refills | Status: DC

## 2021-01-17 MED ORDER — ALBUTEROL SULFATE HFA 108 (90 BASE) MCG/ACT IN AERS: 2.0000 | INHALATION_SPRAY | RESPIRATORY_TRACT | 1 refills | Status: DC | PRN

## 2021-01-17 NOTE — Progress Notes (Signed)
FOLLOW UP  Date of Service/Encounter:  01/17/21   Assessment:   Moderatepersistent asthma, uncomplicated  Gastroesophageal reflux disease - controlled with omeprazole PRN  Allergic rhinoconjunctivitis   Michael Reynolds presents for follow-up visit.  His spirometry looks terrible again, although symptomatically he is doing very well.  He has not required any prednisone, albuterol use, emergency room visits, or antibiotics.  He has not required antibiotics in over 2 years, possibly longer.  Because he is clinically doing so well, I am not going to worry too much about the spirometry.  We will see him again in 6 months or earlier if needed.  Refill sent in.  Plan/Recommendations:   1. Mild persistent asthma, uncomplicated - Lung testing looked a little worse today, but we will not chase that number.  - We sent in refills.  - Daily controller medication(s): AirDuo 113/14mcg one puff twice daily - Prior to physical activity: ProAir 2 puffs 10-15 minutes before physical activity. - Rescue medications: ProAir 4 puffs every 4-6 hours as needed - Asthma control goals:  * Full participation in all desired activities (may need albuterol before activity) * Albuterol use two time or less a week on average (not counting use with activity) * Cough interfering with sleep two time or less a month * Oral steroids no more than once a year * No hospitalizations  2. Gastroesophageal reflux disease - Continue with omeprazole 20mg  daily.    3. Allergic rhinoconjunctivitis - Continue with Xyzal and you can use up to twice daily if needed. - Continue with Nasacort 1-2 sprays per nostril daily when things get particularly well.   4. Return in about 6 months (around 07/20/2021).   Subjective:   Michael Michael Reynolds is a 50 y.o. male presenting today for follow up of  Chief Complaint  Patient presents with  . Asthma    44 Michael Reynolds has a history of the following: Patient Active Problem List    Diagnosis Date Noted  . Moderate persistent asthma, uncomplicated 04/13/2020  . Seasonal and perennial allergic rhinitis 04/13/2020  . Gastroesophageal reflux disease 11/11/2017    History obtained from: chart review and patient.  Michael Reynolds is a 50 y.o. male presenting for a follow up visit.  He was last seen in September 2021.  At that time, his lung testing looks much improved.  We continued him on AirDuo 113/14 mcg 1 puff twice daily as well as albuterol as needed.  For his reflux, we continued with omeprazole 20 mg daily as well as food avoidances.  For his allergic rhinitis, we continue with Xyzal and Nasacort.  Since last visit, he has done well.  Asthma/Respiratory Symptom History: He does report waking up wheezy in the morning when he is going to get up anyway. He remains on the AirDuo one puff twice daily. He uses his rescue inhaler fairly rarely. He usues it once every two weeks or so, maybe less. This is $40 now which works for him. He has not been to the ED and has not needed steroids.   Allergic Rhinitis Symptom History: He remains on the Xyzal. He uses the Nasacort only during certain times of the year. He is going to start using it more often now that the pollen season has come around. His last antibiotic course was two years ago.  GERD Symptom History: He takes omeprazole as needed. He will have some issues at night very rarely. He has been changing his diet. He has tried limiting his greasy  food intake.   Otherwise, there have been no changes to his past medical history, surgical history, family history, or social history.    Review of Systems  Constitutional: Negative.  Negative for fever, malaise/fatigue and weight loss.  HENT: Negative.  Negative for congestion, ear discharge and ear pain.   Eyes: Negative for pain, discharge and redness.  Respiratory: Negative for cough, sputum production, shortness of breath and wheezing.   Cardiovascular: Negative.  Negative for chest  pain and palpitations.  Gastrointestinal: Negative for abdominal pain, constipation, diarrhea, heartburn, nausea and vomiting.  Skin: Negative.  Negative for itching and rash.  Neurological: Negative for dizziness and headaches.  Endo/Heme/Allergies: Negative for environmental allergies. Does not bruise/bleed easily.       Objective:   Blood pressure 124/78, pulse 98, temperature 98.6 F (37 C), resp. rate 18, height 5\' 9"  (1.753 m), weight 272 lb (123.4 kg), SpO2 95 %. Body mass index is 40.17 kg/m.   Physical Exam:  Physical Exam Constitutional:      Appearance: He is well-developed.  HENT:     Head: Atraumatic.     Right Ear: Tympanic membrane, ear canal and external ear normal.     Left Ear: Tympanic membrane, ear canal and external ear normal.     Nose: No nasal deformity, septal deviation, mucosal edema or rhinorrhea.     Right Turbinates: Enlarged and swollen.     Left Turbinates: Enlarged and swollen.     Right Sinus: No maxillary sinus tenderness or frontal sinus tenderness.     Left Sinus: No maxillary sinus tenderness or frontal sinus tenderness.     Mouth/Throat:     Mouth: Mucous membranes are not pale and not dry.     Pharynx: Uvula midline.  Eyes:     General:        Right eye: No discharge.        Left eye: No discharge.     Conjunctiva/sclera: Conjunctivae normal.     Right eye: Right conjunctiva is not injected. No chemosis.    Left eye: Left conjunctiva is not injected. No chemosis.    Pupils: Pupils are equal, round, and reactive to light.  Cardiovascular:     Rate and Rhythm: Normal rate and regular rhythm.     Heart sounds: Normal heart sounds.  Pulmonary:     Effort: Pulmonary effort is normal. No tachypnea, accessory muscle usage or respiratory distress.     Breath sounds: Normal breath sounds. No wheezing, rhonchi or rales.     Comments: Moving air well in all lung fields.  No increased work of breathing. Chest:     Chest wall: No tenderness.   Lymphadenopathy:     Cervical: No cervical adenopathy.  Skin:    Coloration: Skin is not pale.     Findings: No abrasion, erythema, petechiae or rash. Rash is not papular, urticarial or vesicular.  Neurological:     Mental Status: He is alert.  Psychiatric:        Behavior: Behavior is cooperative.      Diagnostic studies:    Spirometry: results abnormal (FEV1: 2.57/68%, FVC: 3.14/64%, FEV1/FVC: 82%).    Spirometry consistent with possible restrictive disease.   Allergy Studies: none           11-30-1994, MD  [facility]

## 2021-01-17 NOTE — Patient Instructions (Addendum)
1. Mild persistent asthma, uncomplicated - Lung testing looked a little worse today, but we will not chase that number.  - We sent in refills.  - Daily controller medication(s): AirDuo 113/51mcg one puff twice daily - Prior to physical activity: ProAir 2 puffs 10-15 minutes before physical activity. - Rescue medications: ProAir 4 puffs every 4-6 hours as needed - Asthma control goals:  * Full participation in all desired activities (may need albuterol before activity) * Albuterol use two time or less a week on average (not counting use with activity) * Cough interfering with sleep two time or less a month * Oral steroids no more than once a year * No hospitalizations  2. Gastroesophageal reflux disease - Continue with omeprazole 20mg  daily.    3. Allergic rhinoconjunctivitis - Continue with Xyzal and you can use up to twice daily if needed. - Continue with Nasacort 1-2 sprays per nostril daily when things get particularly well.   4. Return in about 6 months (around 07/20/2021).    Please inform 07/22/2021 of any Emergency Department visits, hospitalizations, or changes in symptoms. Call us before going to the ED for breathing or allergy symptoms since we might be able to fit you in for a sick visit. Feel free to contact us anytime with any questions, problems, or concerns.  It was a pleasure to see you again today!  Websites that have reliable patient information: 1. American Academy of Asthma, Allergy, and Immunology: www.aaaai.org 2. Food Allergy Research and Education (FARE): foodallergy.org 3. Mothers of Asthmatics: http://www.asthmacommunitynetwork.org 4. American College of Allergy, Asthma, and Immunology: www.acaai.org   COVID-19 Vaccine Information can be found at: [place] For questions related to vaccine distribution or appointments, please email [email or web address] or call [phone].   We realize that you  might be concerned about having an allergic reaction to the COVID19 vaccines. To help with that concern, WE ARE OFFERING THE COVID19 VACCINES IN OUR OFFICE! Ask the front desk for dates!     "Like" [phone] on Facebook and Instagram for our latest updates!      A healthy democracy works best when [place] participate! Make sure you are registered to vote! If you have moved or changed any of your contact information, you will need to get this updated before voting!  In some cases, you MAY be able to register to vote online: [employer]

## 2021-01-18 ENCOUNTER — Other Ambulatory Visit: Payer: Self-pay | Admitting: *Deleted

## 2021-01-23 DIAGNOSIS — E782 Mixed hyperlipidemia: Secondary | ICD-10-CM | POA: Diagnosis not present

## 2021-01-23 DIAGNOSIS — I10 Essential (primary) hypertension: Secondary | ICD-10-CM | POA: Diagnosis not present

## 2021-01-23 DIAGNOSIS — Z1211 Encounter for screening for malignant neoplasm of colon: Secondary | ICD-10-CM | POA: Diagnosis not present

## 2021-03-31 DIAGNOSIS — H6011 Cellulitis of right external ear: Secondary | ICD-10-CM | POA: Diagnosis not present

## 2021-07-20 ENCOUNTER — Ambulatory Visit: Payer: BC Managed Care – PPO | Admitting: Allergy & Immunology

## 2021-07-20 ENCOUNTER — Other Ambulatory Visit: Payer: Self-pay

## 2021-07-20 VITALS — BP 130/68 | HR 99 | Temp 98.1°F | Resp 16 | Ht 69.0 in | Wt 271.2 lb

## 2021-07-20 DIAGNOSIS — J302 Other seasonal allergic rhinitis: Secondary | ICD-10-CM

## 2021-07-20 DIAGNOSIS — J4540 Moderate persistent asthma, uncomplicated: Secondary | ICD-10-CM

## 2021-07-20 DIAGNOSIS — J3089 Other allergic rhinitis: Secondary | ICD-10-CM

## 2021-07-20 DIAGNOSIS — K219 Gastro-esophageal reflux disease without esophagitis: Secondary | ICD-10-CM

## 2021-07-20 NOTE — Patient Instructions (Addendum)
1. Mild persistent asthma, uncomplicated - Lung testing looked stable today.  - Daily controller medication(s): AirDuo 113/8mcg one puff twice daily - Prior to physical activity: ProAir 2 puffs 10-15 minutes before physical activity. - Rescue medications: ProAir 4 puffs every 4-6 hours as needed - Asthma control goals:  * Full participation in all desired activities (may need albuterol before activity) * Albuterol use two time or less a week on average (not counting use with activity) * Cough interfering with sleep two time or less a month * Oral steroids no more than once a year * No hospitalizations  2. Gastroesophageal reflux disease - Stop the omeprazole 20mg  daily.   - Start Protonix 40mg  daily instead (I sent this in, if too expensive, just stick to omeprazole OTC).   3. Allergic rhinoconjunctivitis - Continue with Xyzal and you can use up to twice daily if needed. - Continue with Nasacort 1-2 sprays per nostril daily when things get particularly well.   4. Return in about 6 months (around 01/17/2022).    Please inform of any Emergency Department visits, hospitalizations, or changes in symptoms. Call 01/19/2022 before going to the ED for breathing or allergy symptoms since we might be able to fit you in for a sick visit. Feel free to contact us anytime with any questions, problems, or concerns.  It was a pleasure to see you again today!  Websites that have reliable patient information: 1. American Academy of Asthma, Allergy, and Immunology: www.aaaai.org 2. Food Allergy Research and Education (FARE): foodallergy.org 3. Mothers of Asthmatics: http://www.asthmacommunitynetwork.org 4. American College of Allergy, Asthma, and Immunology: www.acaai.org   COVID-19 Vaccine Information can be found at: [place] For questions related to vaccine distribution or appointments, please email [email or web address] or call  [phone].   We realize that you might be concerned about having an allergic reaction to the COVID19 vaccines. To help with that concern, WE ARE OFFERING THE COVID19 VACCINES IN OUR OFFICE! Ask the front desk for dates!     "Like" [email or web address] on Facebook and Instagram for our latest updates!      A healthy democracy works best when [phone] participate! Make sure you are registered to vote! If you have moved or changed any of your contact information, you will need to get this updated before voting!  In some cases, you MAY be able to register to vote online: [place]

## 2021-07-20 NOTE — Progress Notes (Signed)
FOLLOW UP  Date of Service/Encounter:  07/21/21   Assessment:   Moderate persistent asthma, uncomplicated   Gastroesophageal reflux disease - controlled with omeprazole PRN   Allergic rhinoconjunctivitis   Michael Reynolds presents for follow-up visit.  His spirometry looks terrible again, although symptomatically he is doing very well.  He has not required any prednisone, albuterol use, emergency room visits, or antibiotics.  He has not required antibiotics in over 2 years, possibly longer.  Because he is clinically doing so well, I am not going to worry too much about the spirometry.  We will see him again in 6 months or earlier if needed.  Refill sent in.  Plan/Recommendations:   1. Mild persistent asthma, uncomplicated - Lung testing looked a little worse today, but we will not chase that number.  - We sent in refills.  - Daily controller medication(s): AirDuo 113/58mcg one puff twice daily - Prior to physical activity: ProAir 2 puffs 10-15 minutes before physical activity. - Rescue medications: ProAir 4 puffs every 4-6 hours as needed - Asthma control goals:  * Full participation in all desired activities (may need albuterol before activity) * Albuterol use two time or less a week on average (not counting use with activity) * Cough interfering with sleep two time or less a month * Oral steroids no more than once a year * No hospitalizations  2. Gastroesophageal reflux disease - Continue with omeprazole 20mg  daily.    3. Allergic rhinoconjunctivitis - Continue with Xyzal and you can use up to twice daily if needed. - Continue with Nasacort 1-2 sprays per nostril daily when things get particularly well.   4. Return in about 6 months (around 07/20/2021).   Subjective:   Michael Reynolds is a 50 y.o. male presenting today for follow up of  Chief Complaint  Patient presents with   Follow-up    Patient in today for a follow up for asthma visit and states he has had no new  flares. Medication is taken daily.    [name] has a history of the following: Patient Active Problem List   Diagnosis Date Noted   Moderate persistent asthma, uncomplicated 04/13/2020   Seasonal and perennial allergic rhinitis 04/13/2020   Gastroesophageal reflux disease 11/11/2017    History obtained from: chart review and patient.  Michael Reynolds is a 50 y.o. male presenting for a follow up visit.  He was last seen in September 2021.  At that time, his lung testing looks much improved.  We continued him on AirDuo 113/14 mcg 1 puff twice daily as well as albuterol as needed.  For his reflux, we continued with omeprazole 20 mg daily as well as food avoidances.  For his allergic rhinitis, we continue with Xyzal and Nasacort.  Since last visit, he has done well.  Asthma/Respiratory Symptom History: He does report waking up wheezy in the morning when he is going to get up anyway. He remains on the AirDuo one puff twice daily. He uses his rescue inhaler fairly rarely. He usues it once every two weeks or so, maybe less.  This is $40 now which works for him. He has not been to the ED and has not needed steroids.   Allergic Rhinitis Symptom History: He remains on the Xyzal. He uses the Nasacort only during certain times of the year. He is going to start using it more often now that the pollen season has come around.  His last antibiotic course was two years ago.  GERD Symptom History: He takes omeprazole as needed. He will have some issues at night very rarely. He has been changing his diet. He has tried limiting his greasy food intake.   Otherwise, there have been no changes to his past medical history, surgical history, family history, or social history.    Review of Systems  Constitutional: Negative.  Negative for fever, malaise/fatigue and weight loss.  HENT: Negative.  Negative for congestion, ear discharge and ear pain.   Eyes:  Negative for pain, discharge and redness.  Respiratory:   Negative for cough, sputum production, shortness of breath and wheezing.   Cardiovascular: Negative.  Negative for chest pain and palpitations.  Gastrointestinal:  Negative for abdominal pain, constipation, diarrhea, heartburn, nausea and vomiting.  Skin: Negative.  Negative for itching and rash.  Neurological:  Negative for dizziness and headaches.  Endo/Heme/Allergies:  Negative for environmental allergies. Does not bruise/bleed easily.      Objective:   Blood pressure 130/68, pulse 99, temperature 98.1 F (36.7 C), temperature source Temporal, resp. rate 16, height 5\' 9"  (1.753 m), weight 271 lb 3.2 oz (123 kg), SpO2 96 %. Body mass index is 40.05 kg/m.   Physical Exam:  Physical Exam Constitutional:      Appearance: He is well-developed.     Comments: Talkative. Pleasant.   HENT:     Head: Atraumatic.     Right Ear: Tympanic membrane, ear canal and external ear normal.     Left Ear: Tympanic membrane, ear canal and external ear normal.     Nose: No nasal deformity, septal deviation, mucosal edema or rhinorrhea.     Right Turbinates: Enlarged and swollen.     Left Turbinates: Enlarged and swollen.     Right Sinus: No maxillary sinus tenderness or frontal sinus tenderness.     Left Sinus: No maxillary sinus tenderness or frontal sinus tenderness.     Mouth/Throat:     Mouth: Mucous membranes are not pale and not dry.     Pharynx: Uvula midline.  Eyes:     General:        Right eye: No discharge.        Left eye: No discharge.     Conjunctiva/sclera: Conjunctivae normal.     Right eye: Right conjunctiva is not injected. No chemosis.    Left eye: Left conjunctiva is not injected. No chemosis.    Pupils: Pupils are equal, round, and reactive to light.  Cardiovascular:     Rate and Rhythm: Normal rate and regular rhythm.     Heart sounds: Normal heart sounds.  Pulmonary:     Effort: Pulmonary effort is normal. No tachypnea, accessory muscle usage or respiratory distress.      Breath sounds: Normal breath sounds. No wheezing, rhonchi or rales.     Comments: Moving air well in all lung fields.  No increased work of breathing. Chest:     Chest wall: No tenderness.  Lymphadenopathy:     Cervical: No cervical adenopathy.  Skin:    Coloration: Skin is not pale.     Findings: No abrasion, erythema, petechiae or rash. Rash is not papular, urticarial or vesicular.  Neurological:     Mental Status: He is alert.  Psychiatric:        Behavior: Behavior is cooperative.     Diagnostic studies:    Spirometry: results abnormal (FEV1: 2.57/68%, FVC: 3.14/64%, FEV1/FVC: 82%).    Spirometry consistent with possible restrictive disease.   Allergy Studies: none  Salvatore Marvel, MD  [facility]

## 2021-07-21 ENCOUNTER — Encounter: Payer: Self-pay | Admitting: Allergy & Immunology

## 2021-07-21 MED ORDER — PANTOPRAZOLE SODIUM 40 MG PO TBEC: 40.0000 mg | DELAYED_RELEASE_TABLET | Freq: Every day | ORAL | 5 refills | Status: DC

## 2021-07-21 NOTE — Addendum Note (Signed)
Addended by: Orson Aloe on: 07/21/2021 09:05 AM   Modules accepted: Orders

## 2021-08-09 DIAGNOSIS — K219 Gastro-esophageal reflux disease without esophagitis: Secondary | ICD-10-CM | POA: Diagnosis not present

## 2021-08-09 DIAGNOSIS — Z0000 Encounter for general adult medical examination without abnormal findings: Secondary | ICD-10-CM | POA: Diagnosis not present

## 2021-08-09 DIAGNOSIS — R600 Localized edema: Secondary | ICD-10-CM | POA: Diagnosis not present

## 2021-08-09 DIAGNOSIS — Z23 Encounter for immunization: Secondary | ICD-10-CM | POA: Diagnosis not present

## 2021-08-09 DIAGNOSIS — E782 Mixed hyperlipidemia: Secondary | ICD-10-CM | POA: Diagnosis not present

## 2021-08-09 DIAGNOSIS — I10 Essential (primary) hypertension: Secondary | ICD-10-CM | POA: Diagnosis not present

## 2021-12-01 ENCOUNTER — Other Ambulatory Visit: Payer: Self-pay | Admitting: Allergy & Immunology

## 2021-12-25 ENCOUNTER — Other Ambulatory Visit: Payer: Self-pay | Admitting: Allergy & Immunology

## 2022-01-18 ENCOUNTER — Ambulatory Visit: Payer: BC Managed Care – PPO | Admitting: Allergy & Immunology

## 2022-01-18 ENCOUNTER — Encounter: Payer: Self-pay | Admitting: Allergy & Immunology

## 2022-01-18 ENCOUNTER — Other Ambulatory Visit: Payer: Self-pay

## 2022-01-18 VITALS — BP 118/60 | HR 90 | Temp 98.3°F | Resp 16 | Ht 69.0 in | Wt 265.2 lb

## 2022-01-18 DIAGNOSIS — J4540 Moderate persistent asthma, uncomplicated: Secondary | ICD-10-CM | POA: Diagnosis not present

## 2022-01-18 DIAGNOSIS — J3089 Other allergic rhinitis: Secondary | ICD-10-CM

## 2022-01-18 DIAGNOSIS — K219 Gastro-esophageal reflux disease without esophagitis: Secondary | ICD-10-CM | POA: Diagnosis not present

## 2022-01-18 MED ORDER — AIRDUO DIGIHALER 113-14 MCG/ACT IN AEPB: 1.0000 | INHALATION_SPRAY | Freq: Two times a day (BID) | RESPIRATORY_TRACT | 5 refills | Status: DC

## 2022-01-18 MED ORDER — ALBUTEROL SULFATE HFA 108 (90 BASE) MCG/ACT IN AERS: 2.0000 | INHALATION_SPRAY | RESPIRATORY_TRACT | 1 refills | Status: DC | PRN

## 2022-01-18 MED ORDER — LEVOCETIRIZINE DIHYDROCHLORIDE 5 MG PO TABS
5.0000 mg | ORAL_TABLET | Freq: Every evening | ORAL | 3 refills | Status: DC
Start: 2022-01-18 — End: 2023-01-29

## 2022-01-18 MED ORDER — TRIAMCINOLONE ACETONIDE 55 MCG/ACT NA AERO: 2.0000 | INHALATION_SPRAY | Freq: Every day | NASAL | 5 refills | Status: DC

## 2022-01-18 NOTE — Progress Notes (Signed)
? ?FOLLOW UP ? ?Date of Service/Encounter:  01/18/22 ? ? ?Assessment:  ? ?Moderate persistent asthma, uncomplicated ?  ?Gastroesophageal reflux disease - controlled with omeprazole PRN ?  ?Allergic rhinoconjunctivitis ? ?Plan/Recommendations:  ? ?1. Mild persistent asthma, uncomplicated ?- Lung testing looked stable today.  ?- Daily controller medication(s): AirDuo 113/61mcg one puff twice daily ?- Prior to physical activity: ProAir 2 puffs 10-15 minutes before physical activity. ?- Rescue medications: ProAir 4 puffs every 4-6 hours as needed ?- Asthma control goals:  ?* Full participation in all desired activities (may need albuterol before activity) ?* Albuterol use two time or less a week on average (not counting use with activity) ?* Cough interfering with sleep two time or less a month ?* Oral steroids no more than once a year ?* No hospitalizations ? ?2. Gastroesophageal reflux disease ?- Continue with omeprazole as needed.  ? ?3. Allergic rhinoconjunctivitis ?- Continue with Xyzal and you can use up to twice daily if needed. ?- Continue with Nasacort 1-2 sprays per nostril daily when things get particularly well.  ? ?4. Return in about 6 months (around 07/21/2022).  ? ? ?Subjective:  ? ?Michael Reynolds is a 51 y.o. male presenting today for follow up of  ?Chief Complaint  ?Patient presents with  ? Asthma  ?  Says he is well. Only pollen really flares his asthma.   ? Sinus Problem  ?  Post nasal drip.  ? Allergic Rhinitis   ?  Pollen is bothersome.   ? ? ?Michael Reynolds has a history of the following: ?Patient Active Problem List  ? Diagnosis Date Noted  ? Moderate persistent asthma, uncomplicated [id number]  ? Seasonal and perennial allergic rhinitis 04/13/2020  ? Gastroesophageal reflux disease 11/11/2017  ? ? ?History obtained from: chart review and patient. ? ?Michael Reynolds is a 51 y.o. male presenting for a follow up visit.  He was last seen in September 2022.  At that time, he was doing very well.  His  lung testing did look a little worse, we decided to continue with what he was on.  He was stable symptomatically on AirDuo 113/14 mcg 1 puff twice daily as well as albuterol as needed.  Reflux was controlled with omeprazole 20 mg daily.  Allergic rhinoconjunctivitis was controlled with Xyzal twice daily as needed as well as Nasacort. ? ?Asthma/Respiratory Symptom History: He remains on the AirDuo 113/21mcg one puff twice daily. [facility] and it is $20 per refill. He has not needed prednisone at all since the last visit. He has not been using his rescue inhaler much at all. This is typically a bad time of the year for him. He lives in an older home and ne notices that it gets damp in there. It is a church owned building.  He has a crawlspace and he is not sure if it has a vapor barrier.  ? ?Allergic Rhinitis Symptom History: He does not like taking the Nasonex every day, but he will use it consistently when he has symptoms. He has not needed antibiotics. He has not had a sinus infection in ages. The Xyzal he takes every day.  ? ?GERD Symptom History: He uses omeprazole only on a as needed basis. ? 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  ? ?Otherwise, there have been no changes to his past medical history, surgical history, family history, or social history. ? ? ? ?Review of  Systems  ?Constitutional: Negative.  Negative for fever, malaise/fatigue and weight loss.  ?HENT: Negative.  Negative for congestion, ear discharge and ear pain.   ?Eyes:  Negative for pain, discharge and redness.  ?Respiratory:  Negative for cough, sputum production, shortness of breath and wheezing.   ?Cardiovascular: Negative.  Negative for chest pain and palpitations.  ?Gastrointestinal:  Negative for abdominal pain, constipation, diarrhea, heartburn, nausea and vomiting.  ?Skin: Negative.  Negative for itching and rash.  ?Neurological:  Negative for dizziness and headaches.   ?Endo/Heme/Allergies:  Negative for environmental allergies. Does not bruise/bleed easily.   ? ? ? ?Objective:  ? ?Blood pressure 118/60, pulse 90, temperature 98.3 ?F (36.8 ?C), temperature source Temporal, resp. rate 16, height 5\' 9"  (1.753 m), weight 265 lb 3.2 oz (120.3 kg), SpO2 96 %. ?Body mass index is 39.16 kg/m?. ? ? ? ?Physical Exam ?Vitals reviewed.  ?Constitutional:   ?   Appearance: He is well-developed.  ?   Comments: Talkative. Pleasant.   ?HENT:  ?   Head: Atraumatic.  ?   Right Ear: Tympanic membrane, ear canal and external ear normal.  ?   Left Ear: Tympanic membrane, ear canal and external ear normal.  ?   Nose: No nasal deformity, septal deviation, mucosal edema or rhinorrhea.  ?   Right Turbinates: Enlarged and swollen.  ?   Left Turbinates: Enlarged and swollen.  ?   Right Sinus: No maxillary sinus tenderness or frontal sinus tenderness.  ?   Left Sinus: No maxillary sinus tenderness or frontal sinus tenderness.  ?   Mouth/Throat:  ?   Mouth: Mucous membranes are moist. Mucous membranes are not pale and not dry.  ?   Pharynx: Uvula midline.  ?Eyes:  ?   General: Allergic shiner present.     ?   Right eye: No discharge.     ?   Left eye: No discharge.  ?   Conjunctiva/sclera: Conjunctivae normal.  ?   Right eye: Right conjunctiva is not injected. No chemosis. ?   Left eye: Left conjunctiva is not injected. No chemosis. ?   Pupils: Pupils are equal, round, and reactive to light.  ?Cardiovascular:  ?   Rate and Rhythm: Normal rate and regular rhythm.  ?   Heart sounds: Normal heart sounds.  ?Pulmonary:  ?   Effort: Pulmonary effort is normal. No tachypnea, accessory muscle usage or respiratory distress.  ?   Breath sounds: Normal breath sounds. No wheezing, rhonchi or rales.  ?   Comments: Moving air well in all lung fields.  No increased work of breathing. ?Chest:  ?   Chest wall: No tenderness.  ?Lymphadenopathy:  ?   Cervical: No cervical adenopathy.  ?Skin: ?   Coloration: Skin is not pale.   ?   Findings: No abrasion, erythema, petechiae or rash. Rash is not papular, urticarial or vesicular.  ?Neurological:  ?   Mental Status: He is alert.  ?Psychiatric:     ?   Behavior: Behavior is cooperative.  ?  ? ?Diagnostic studies:  ? ?Spirometry: results normal (FEV1: 2.75/73%, FVC: 3.47/72%, FEV1/FVC: 79%).  ?  ?Spirometry consistent with normal pattern. Overall spirometry looks MUCH better than the last one.  ? ?Allergy Studies: none ? ? ? ? ? ?  ?Salvatore Marvel, MD  ?[facility] ? ? ? ? ? ? ?

## 2022-01-18 NOTE — Patient Instructions (Addendum)
1. Mild persistent asthma, uncomplicated ?- Lung testing looked stable today.  ?- Daily controller medication(s): AirDuo 113/55mcg one puff twice daily ?- Prior to physical activity: ProAir 2 puffs 10-15 minutes before physical activity. ?- Rescue medications: ProAir 4 puffs every 4-6 hours as needed ?- Asthma control goals:  ?* Full participation in all desired activities (may need albuterol before activity) ?* Albuterol use two time or less a week on average (not counting use with activity) ?* Cough interfering with sleep two time or less a month ?* Oral steroids no more than once a year ?* No hospitalizations ? ?2. Gastroesophageal reflux disease ?- Continue with omeprazole as needed.  ? ?3. Allergic rhinoconjunctivitis ?- Continue with Xyzal and you can use up to twice daily if needed. ?- Continue with Nasacort 1-2 sprays per nostril daily when things get particularly well.  ? ?4. Return in about 6 months (around 07/21/2022).  ? ? ?Please inform us of any Emergency Department visits, hospitalizations, or changes in symptoms. Call us before going to the ED for breathing or allergy symptoms since we might be able to fit you in for a sick visit. Feel free to contact us anytime with any questions, problems, or concerns. ? ?It was a pleasure to see you again today! ? ?Websites that have reliable patient information: ?1. American Academy of Asthma, Allergy, and Immunology: www.aaaai.org ?2. Food Allergy Research and Education (FARE): foodallergy.org ?3. Mothers of Asthmatics: http://www.asthmacommunitynetwork.org ?4. [employer] of Allergy, Asthma, and Immunology: MonthlyElectricBill.co.uk ? ? ?COVID-19 Vaccine Information can be found at: ShippingScam.co.uk For questions related to vaccine distribution or appointments, please email [email or web address] or call [phone].  ? ?We realize that you might be concerned about having an allergic reaction to the COVID19  vaccines. To help with that concern, WE ARE OFFERING THE COVID19 VACCINES IN OUR OFFICE! Ask the front desk for dates!  ? ? ? ??Like? [place] on Facebook and Instagram for our latest updates!  ?  ? ? ?A healthy democracy works best when [employer] participate! Make sure you are registered to vote! If you have moved or changed any of your contact information, you will need to get this updated before voting! ? ?In some cases, you MAY be able to register to vote online: [email or web address] ? ? ? ? ?

## 2022-02-07 DIAGNOSIS — E782 Mixed hyperlipidemia: Secondary | ICD-10-CM | POA: Diagnosis not present

## 2022-02-07 DIAGNOSIS — I10 Essential (primary) hypertension: Secondary | ICD-10-CM | POA: Diagnosis not present

## 2022-02-07 DIAGNOSIS — R600 Localized edema: Secondary | ICD-10-CM | POA: Diagnosis not present

## 2022-05-10 ENCOUNTER — Other Ambulatory Visit: Payer: Self-pay

## 2022-05-10 MED ORDER — AIRDUO DIGIHALER 113-14 MCG/ACT IN AEPB: 1.0000 | INHALATION_SPRAY | Freq: Two times a day (BID) | RESPIRATORY_TRACT | 5 refills | Status: DC

## 2022-05-10 NOTE — Telephone Encounter (Signed)
Thank you for taking care of this!  Lou Irigoyen, MD [facility]  

## 2022-05-10 NOTE — Telephone Encounter (Signed)
Patient called in  - DOB verified - asking about coupon for Airduo Digihaler 113-14 MCT/ACT. Patient was advised coupon had expired and there would not be another one, unfortunately.  Patient advised prescription could be sent to [facility], to see if the cost would be cheaper for him - if not, then the provider would review his history to see what alternative would be best suited for him.  Patient stated he wanted to try [facility] first - gave him pharmacy information save as well.  Electronically sending prescription to specialty pharmacy and routing to providing as update.

## 2022-05-29 NOTE — Telephone Encounter (Signed)
Patient called back stating it is $150 at [facility]. Patient is requesting a prescription change as this is to expensive.

## 2022-05-30 ENCOUNTER — Other Ambulatory Visit: Payer: Self-pay

## 2022-05-30 DIAGNOSIS — L719 Rosacea, unspecified: Secondary | ICD-10-CM | POA: Insufficient documentation

## 2022-05-30 DIAGNOSIS — E782 Mixed hyperlipidemia: Secondary | ICD-10-CM | POA: Insufficient documentation

## 2022-05-30 DIAGNOSIS — E7800 Pure hypercholesterolemia, unspecified: Secondary | ICD-10-CM | POA: Insufficient documentation

## 2022-05-30 DIAGNOSIS — I10 Essential (primary) hypertension: Secondary | ICD-10-CM | POA: Insufficient documentation

## 2022-05-30 DIAGNOSIS — R609 Edema, unspecified: Secondary | ICD-10-CM | POA: Insufficient documentation

## 2022-05-30 DIAGNOSIS — J45909 Unspecified asthma, uncomplicated: Secondary | ICD-10-CM | POA: Insufficient documentation

## 2022-05-30 DIAGNOSIS — E890 Postprocedural hypothyroidism: Secondary | ICD-10-CM | POA: Insufficient documentation

## 2022-05-30 DIAGNOSIS — L711 Rhinophyma: Secondary | ICD-10-CM | POA: Insufficient documentation

## 2022-05-30 DIAGNOSIS — A4902 Methicillin resistant Staphylococcus aureus infection, unspecified site: Secondary | ICD-10-CM | POA: Insufficient documentation

## 2022-05-30 MED ORDER — DULERA 100-5 MCG/ACT IN AERO: 2.0000 | INHALATION_SPRAY | Freq: Two times a day (BID) | RESPIRATORY_TRACT | 5 refills | Status: DC

## 2022-05-30 NOTE — Telephone Encounter (Signed)
Spoke with patient and [facility], rx sent. Nothing further needed at this time.

## 2022-05-30 NOTE — Addendum Note (Signed)
Addended by: Shelby Dubin on: 05/30/2022 12:22 PM   Modules accepted: Orders

## 2022-05-30 NOTE — Addendum Note (Signed)
Addended by: Alfonse Spruce on: 05/30/2022 10:09 AM   Modules accepted: Orders

## 2022-05-30 NOTE — Telephone Encounter (Signed)
I checked it out and it looks like Dulera might be covered. But I am not certain. I pended Dulera one puff BID. Can you confirm a pharmacy and send in on my behalf.   Malachi Bonds, MD [facility]

## 2022-07-26 ENCOUNTER — Ambulatory Visit: Payer: BC Managed Care – PPO | Admitting: Allergy & Immunology

## 2022-08-02 ENCOUNTER — Ambulatory Visit: Payer: BC Managed Care – PPO | Admitting: Family Medicine

## 2022-08-02 ENCOUNTER — Encounter: Payer: Self-pay | Admitting: Family Medicine

## 2022-08-02 VITALS — BP 98/76 | HR 85 | Temp 98.7°F | Resp 16 | Ht 67.75 in | Wt 267.0 lb

## 2022-08-02 DIAGNOSIS — H1013 Acute atopic conjunctivitis, bilateral: Secondary | ICD-10-CM | POA: Diagnosis not present

## 2022-08-02 DIAGNOSIS — J3089 Other allergic rhinitis: Secondary | ICD-10-CM | POA: Diagnosis not present

## 2022-08-02 DIAGNOSIS — K219 Gastro-esophageal reflux disease without esophagitis: Secondary | ICD-10-CM

## 2022-08-02 DIAGNOSIS — J302 Other seasonal allergic rhinitis: Secondary | ICD-10-CM

## 2022-08-02 DIAGNOSIS — H1010 Acute atopic conjunctivitis, unspecified eye: Secondary | ICD-10-CM

## 2022-08-02 DIAGNOSIS — J4540 Moderate persistent asthma, uncomplicated: Secondary | ICD-10-CM | POA: Diagnosis not present

## 2022-08-02 MED ORDER — FLUTICASONE-SALMETEROL 250-50 MCG/ACT IN AEPB: 1.0000 | INHALATION_SPRAY | Freq: Two times a day (BID) | RESPIRATORY_TRACT | 5 refills | Status: DC

## 2022-08-02 NOTE — Progress Notes (Signed)
[address] [place] [address] Dept: [phone]  FOLLOW UP NOTE  Patient ID: Michael Reynolds, male    DOB: 06/13/1971  Age: 51 y.o. MRN: [id number] Date of Office Visit: 08/02/2022  Assessment  Chief Complaint: Asthma (6 mth f/u - )  HPI Michael Reynolds is a 51 year old male who presents the clinic for follow-up visit.  He was last seen in this clinic on 01/18/2022 by Dr. Dellis Anes for evaluation of asthma, allergic rhinitis, allergic conjunctivitis, and reflux.  At today's visit, he reports his asthma has been moderately well controlled with symptoms including wheeze occurring mainly at night that began about 2 weeks ago.  He denies shortness of breath and cough with rest and activity.  He had been using Dulera, however, he reports he is out of this medication for about 2 weeks.  He reports that this medication is not well covered on his insurance and he would like to explore alternative asthma maintenance inhalers at this time.  Allergic rhinitis is reported as moderately well controlled with symptoms including clear rhinorrhea, nasal congestion, and postnasal drainage.  He continues Xyzal 5 mg once a day and occasionally uses Nasacort and nasal saline rinses.  Allergic conjunctivitis is reported as moderately well controlled with occasional red and itchy eyes for which he continues a lubricating eyedrop with relief of symptoms.  Reflux is reported as well controlled with omeprazole as needed which she reports about once a week.  His current medications are listed in the chart.   Drug Allergies:  Allergies  Allergen Reactions   Dicloxacillin Anaphylaxis   Latex Rash and Hives    Physical Exam: BP 98/76   Pulse 85   Temp 98.7 F (37.1 C) (Temporal)   Resp 16   Ht 5' 7.75" (1.721 m)   Wt 267 lb (121.1 kg)   SpO2 98%   BMI 40.90 kg/m    Physical Exam Vitals reviewed.  Constitutional:      Appearance: Normal appearance.  HENT:     Head: Normocephalic and  atraumatic.     Right Ear: Tympanic membrane normal.     Left Ear: Tympanic membrane normal.     Nose:     Comments: Bilateral naris slightly erythematous with clear nasal drainage noted.  Pharynx normal.  Ears normal.  Eyes normal.    Mouth/Throat:     Mouth: Mucous membranes are moist.  Eyes:     Conjunctiva/sclera: Conjunctivae normal.  Cardiovascular:     Rate and Rhythm: Normal rate and regular rhythm.     Heart sounds: Normal heart sounds. No murmur heard. Pulmonary:     Effort: Pulmonary effort is normal.     Breath sounds: Normal breath sounds.     Comments: Lungs clear to auscultation Musculoskeletal:        General: Normal range of motion.     Cervical back: Normal range of motion and neck supple.  Skin:    General: Skin is warm and dry.  Neurological:     Mental Status: He is alert and oriented to person, place, and time.  Psychiatric:        Mood and Affect: Mood normal.        Behavior: Behavior normal.        Thought Content: Thought content normal.        Judgment: Judgment normal.     Diagnostics: FVC 2.84, FEV1 2.12.  Predicted FVC 4.77, predicted FEV1 3.76.  Spirometry indicates possible restriction.  Assessment and Plan: 1. Moderate persistent asthma, uncomplicated   2. Seasonal and perennial allergic rhinitis   3. Seasonal allergic conjunctivitis   4. Gastroesophageal reflux disease, unspecified whether esophagitis present     Meds ordered this encounter  Medications   DISCONTD: fluticasone-salmeterol (ADVAIR) 250-50 MCG/ACT AEPB    Sig: Inhale 1 puff into the lungs in the morning and at bedtime.    Dispense:  1 each    Refill:  5   fluticasone-salmeterol (ADVAIR) 250-50 MCG/ACT AEPB    Sig: Inhale 1 puff into the lungs in the morning and at bedtime.    Dispense:  1 each    Refill:  5    Patient Instructions  Asthma Begin Advair Diskus 1 puff twice a day to prevent cough or wheeze Continue albuterol 2 puffs once every 4 hours as needed for  cough or wheeze You may use albuterol 2 puffs 5 to 15 minutes before activity to decrease cough or wheeze  Allergic rhinitis Continue Xyzal 5 mg once a day as needed for runny nose or itch Continue Nasacort 2 sprays in each nostril once a day as needed for stuffy nose Consider saline nasal rinses as needed for nasal symptoms. Use this before any medicated nasal sprays for best result  Allergic conjunctivitis Some over the counter eye drops include Pataday one drop in each eye once a day as needed for red, itchy eyes OR Zaditor one drop in each eye twice a day as needed for red itchy eyes.  Reflux Continue dietary and lifestyle modifications as listed below Continue omeprazole as previously prescribed  Call the clinic if this treatment plan is not working well for you.  Follow up in 6 months or sooner if needed.  No follow-ups on file.    Thank you for the opportunity to care for this patient.  Please do not hesitate to contact me with questions.  Gareth Morgan, FNP [facility] [place]

## 2022-08-02 NOTE — Patient Instructions (Addendum)
Asthma Begin Advair Diskus 250-1 puff twice a day to prevent cough or wheeze Continue albuterol 2 puffs once every 4 hours as needed for cough or wheeze You may use albuterol 2 puffs 5 to 15 minutes before activity to decrease cough or wheeze  Allergic rhinitis Continue Xyzal 5 mg once a day as needed for runny nose or itch Continue Nasacort 2 sprays in each nostril once a day as needed for stuffy nose Consider saline nasal rinses as needed for nasal symptoms. Use this before any medicated nasal sprays for best result  Allergic conjunctivitis Some over the counter eye drops include Pataday one drop in each eye once a day as needed for red, itchy eyes OR Zaditor one drop in each eye twice a day as needed for red itchy eyes.  Reflux Continue dietary and lifestyle modifications as listed below Continue omeprazole as previously prescribed  Call the clinic if this treatment plan is not working well for you.  Follow up in 6 months or sooner if needed.

## 2022-08-09 NOTE — Addendum Note (Signed)
Addended by: Norville Haggard on: 08/09/2022 05:41 PM   Modules accepted: Orders

## 2022-09-12 DIAGNOSIS — Z125 Encounter for screening for malignant neoplasm of prostate: Secondary | ICD-10-CM | POA: Diagnosis not present

## 2022-09-12 DIAGNOSIS — Z0000 Encounter for general adult medical examination without abnormal findings: Secondary | ICD-10-CM | POA: Diagnosis not present

## 2022-09-12 DIAGNOSIS — I10 Essential (primary) hypertension: Secondary | ICD-10-CM | POA: Diagnosis not present

## 2022-09-12 DIAGNOSIS — R600 Localized edema: Secondary | ICD-10-CM | POA: Diagnosis not present

## 2022-09-12 DIAGNOSIS — Z23 Encounter for immunization: Secondary | ICD-10-CM | POA: Diagnosis not present

## 2022-09-12 DIAGNOSIS — E782 Mixed hyperlipidemia: Secondary | ICD-10-CM | POA: Diagnosis not present

## 2022-09-12 DIAGNOSIS — K219 Gastro-esophageal reflux disease without esophagitis: Secondary | ICD-10-CM | POA: Diagnosis not present

## 2023-01-29 ENCOUNTER — Encounter: Payer: Self-pay | Admitting: Allergy & Immunology

## 2023-01-29 ENCOUNTER — Other Ambulatory Visit: Payer: Self-pay

## 2023-01-29 ENCOUNTER — Ambulatory Visit: Payer: BC Managed Care – PPO | Admitting: Allergy & Immunology

## 2023-01-29 VITALS — BP 122/76 | HR 75 | Temp 97.9°F | Resp 18

## 2023-01-29 DIAGNOSIS — J3089 Other allergic rhinitis: Secondary | ICD-10-CM | POA: Diagnosis not present

## 2023-01-29 DIAGNOSIS — J302 Other seasonal allergic rhinitis: Secondary | ICD-10-CM | POA: Diagnosis not present

## 2023-01-29 DIAGNOSIS — K219 Gastro-esophageal reflux disease without esophagitis: Secondary | ICD-10-CM | POA: Diagnosis not present

## 2023-01-29 DIAGNOSIS — J4540 Moderate persistent asthma, uncomplicated: Secondary | ICD-10-CM | POA: Diagnosis not present

## 2023-01-29 MED ORDER — BREZTRI AEROSPHERE 160-9-4.8 MCG/ACT IN AERO: 2.0000 | INHALATION_SPRAY | Freq: Two times a day (BID) | RESPIRATORY_TRACT | 5 refills | Status: DC

## 2023-01-29 MED ORDER — TRIAMCINOLONE ACETONIDE 55 MCG/ACT NA AERO: 2.0000 | INHALATION_SPRAY | Freq: Every day | NASAL | 5 refills | Status: AC

## 2023-01-29 MED ORDER — ALBUTEROL SULFATE HFA 108 (90 BASE) MCG/ACT IN AERS: 2.0000 | INHALATION_SPRAY | RESPIRATORY_TRACT | 1 refills | Status: DC | PRN

## 2023-01-29 MED ORDER — LEVOCETIRIZINE DIHYDROCHLORIDE 5 MG PO TABS: 5.0000 mg | ORAL_TABLET | Freq: Every evening | ORAL | 5 refills | Status: AC

## 2023-01-29 NOTE — Progress Notes (Signed)
FOLLOW UP  Date of Service/Encounter:  01/29/23   Assessment:   Moderate persistent asthma, uncomplicated - largely doing well, although medication coverage has been a problem   Gastroesophageal reflux disease - controlled with omeprazole PRN   Allergic rhinoconjunctivitis  Plan/Recommendations:   1. Mild persistent asthma, uncomplicated - Lung testing looked stable today.  - We are going to send in [place] (contains three medications to help with breathing). - There is a $0 copay card provided.  - Call us if it is too expensive.  - Daily controller medication(s): Breztri two puffs twice daily  - Prior to physical activity: albuterol 2 puffs 10-15 minutes before physical activity. - Rescue medications: albuterol 4 puffs every 4-6 hours as needed - Asthma control goals:  * Full participation in all desired activities (may need albuterol before activity) * Albuterol use two time or less a week on average (not counting use with activity) * Cough interfering with sleep two time or less a month * Oral steroids no more than once a year * No hospitalizations  2. Gastroesophageal reflux disease - Continue with omeprazole as needed.   3. Allergic rhinoconjunctivitis - Continue with Xyzal and you can use up to twice daily if needed. - Continue with Nasacort 1-2 sprays per nostril daily when things get particularly well.  - Call us with issues.   4. Return in about 6 months (around 07/31/2023).    Subjective:   Michael Reynolds is a 52 y.o. 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  Michael Reynolds is a 52 y.o. male presenting for a follow up visit.  He was last seen in October 2023.  At that time, Michael Reynolds started him on Advair 1 puff twice daily as well as albuterol as needed.  For his rhinitis, he was continued on the levocetirizine as well as the nasal spray.   Since the last visit, he has  done well.   Asthma/Respiratory Symptom History: He is on Advair  250 one puff 1-2 times daily. It is around $100 a month. This is "tier 2" evidently. He has had problems affording his medications since I first started taking care of him, unfortunately. But he is sure to use at least one puff once daily to keep on top of his symptoms. He has not been using his albuterol much at all. He has not been to the ED nor has he been hospitalized for his breathing. He has not been using any prednisone at all.   Allergic Rhinitis Symptom History: He does the Xyzal daily but he is not using the Nasacort daily. He was doing well until the last couple of weeks. He recently went to [facility] for a conference in early March  and was placed on antibiotics for a sinus infection that he contracted during the plane ride.  He otherwise has not needed antibiotics at all since the last time that I saw him. He was never on allergy shots when followiung with [place]. But he was on shots as a child.   GERD Symptom History: He takes the reflux medication as needed.  He feels that his symptoms are under fairy control with this regimen.   He is going to be heading to [facility] at the end of the  month. I think that this is more of a pleasure trip. His church is going well. He had a busy weekend with the Easter holiday. He is taking a few days off after the busy weekend.   Otherwise, there have been no changes to his past medical history, surgical history, family history, or social history.    Review of Systems  Constitutional: Negative.  Negative for chills, fever, malaise/fatigue and weight loss.  HENT: Negative.  Negative for congestion, ear discharge, ear pain and sinus pain.   Eyes:  Negative for pain, discharge and redness.  Respiratory:  Positive for shortness of breath. Negative for cough, sputum production and wheezing.   Cardiovascular: Negative.  Negative for chest pain and palpitations.  Gastrointestinal:  Negative for abdominal pain, constipation, diarrhea, heartburn, nausea and vomiting.  Skin: Negative.  Negative for itching and rash.  Neurological:  Negative for dizziness and headaches.  Endo/Heme/Allergies:  Positive for environmental allergies. Does not bruise/bleed easily.       Objective:   Blood pressure 122/76, pulse 75, temperature 97.9 F (36.6 C), temperature source Oral, resp. rate 18, SpO2 95 %. There is no height or weight on file to calculate BMI.    Physical Exam Vitals reviewed.  Constitutional:      Appearance: He is well-developed.     Comments: Talkative. Pleasant.   HENT:     Head: Normocephalic and atraumatic.     Right Ear: Tympanic membrane, ear canal and external ear normal.     Left Ear: Tympanic membrane, ear canal and external ear normal.     Nose: No nasal deformity, septal deviation, mucosal edema or rhinorrhea.     Right Turbinates: Enlarged, swollen and pale.     Left Turbinates: Enlarged, swollen and pale.     Right Sinus: No maxillary sinus tenderness or frontal sinus tenderness.     Left Sinus: No maxillary sinus tenderness or frontal sinus tenderness.     Mouth/Throat:     Mouth: Mucous membranes are moist. Mucous  membranes are not pale and not dry.     Pharynx: Uvula midline.     Comments: Cobblestoning in the posterior orpharynx.  Eyes:     General: Allergic shiner present.        Right eye: No discharge.        Left eye: No discharge.     Conjunctiva/sclera: Conjunctivae normal.     Right eye: Right conjunctiva is not injected. No chemosis.    Left eye: Left conjunctiva is not injected. No chemosis.    Pupils: Pupils are equal, round, and reactive to light.  Cardiovascular:     Rate and Rhythm: Normal rate and regular rhythm.     Heart sounds: Normal heart sounds.  Pulmonary:     Effort: Pulmonary effort is normal. No tachypnea, accessory muscle usage or respiratory distress.     Breath sounds: Normal breath sounds. No wheezing, rhonchi or rales.     Comments:  Moving air well in all lung fields.  No increased work of breathing. Chest:     Chest wall: No tenderness.  Lymphadenopathy:     Cervical: No cervical adenopathy.  Skin:    Coloration: Skin is not pale.     Findings: No abrasion, erythema, petechiae or rash. Rash is not papular, urticarial or vesicular.  Neurological:     Mental Status: He is alert.  Psychiatric:        Behavior: Behavior is cooperative.      Diagnostic studies:    Spirometry: results normal (FEV1: 3.13/86%, FVC: 3.93/86%, FEV1/FVC: 80%).    Spirometry consistent with normal pattern.   Allergy Studies: none        Salvatore Marvel, MD  [facility] [place]

## 2023-01-29 NOTE — Patient Instructions (Addendum)
1. Mild persistent asthma, uncomplicated - Lung testing looked stable today.  - We are going to send in [place] (contains three medications to help with breathing). - There is a $0 copay card provided.  - Call us if it is too expensive.  - Daily controller medication(s): Breztri two puffs twice daily  - Prior to physical activity: albuterol 2 puffs 10-15 minutes before physical activity. - Rescue medications: albuterol 4 puffs every 4-6 hours as needed - Asthma control goals:  * Full participation in all desired activities (may need albuterol before activity) * Albuterol use two time or less a week on average (not counting use with activity) * Cough interfering with sleep two time or less a month * Oral steroids no more than once a year * No hospitalizations  2. Gastroesophageal reflux disease - Continue with omeprazole as needed.   3. Allergic rhinoconjunctivitis - Continue with Xyzal and you can use up to twice daily if needed. - Continue with Nasacort 1-2 sprays per nostril daily when things get particularly well.  - Call us with issues.   4. Return in about 6 months (around 07/31/2023).    Please inform us of any Emergency Department visits, hospitalizations, or changes in symptoms. Call us before going to the ED for breathing or allergy symptoms since we might be able to fit you in for a sick visit. Feel free to contact us anytime with any questions, problems, or concerns.  It was a pleasure to see you again today!  Websites that have reliable patient information: 1. American Academy of Asthma, Allergy, and Immunology: www.aaaai.org 2. Food Allergy Research and Education (FARE): foodallergy.org 3. Mothers of Asthmatics: http://www.asthmacommunitynetwork.org 4. American College of Allergy, Asthma, and Immunology: www.acaai.org   COVID-19 Vaccine Information can be found at: ShippingScam.co.uk For questions related to  vaccine distribution or appointments, please email [email or web address] or call [phone].   We realize that you might be concerned about having an allergic reaction to the COVID19 vaccines. To help with that concern, WE ARE OFFERING THE COVID19 VACCINES IN OUR OFFICE! Ask the front desk for dates!     "Like" [place] on Facebook and Instagram for our latest updates!      A healthy democracy works best when [employer] participate! Make sure you are registered to vote! If you have moved or changed any of your contact information, you will need to get this updated before voting!  In some cases, you MAY be able to register to vote online: [email or web address]

## 2023-02-12 ENCOUNTER — Telehealth: Payer: Self-pay | Admitting: Allergy & Immunology

## 2023-02-12 NOTE — Telephone Encounter (Signed)
I am going to have the reps call the pharmacy about this just to confirm that they are running the copay card correctly.   Malachi Bonds, MD [facility]

## 2023-02-12 NOTE — Telephone Encounter (Signed)
Patient called and said that he went to [facility] to pickup the Breztric with the coupon and it was over $400.00. needs something else.[facility] on [address] [phone].

## 2023-02-12 NOTE — Telephone Encounter (Signed)
Dr. Dellis Anes what other inhaler besides Markus Daft would you like him to try?   [facility] [phone]

## 2023-03-13 DIAGNOSIS — I10 Essential (primary) hypertension: Secondary | ICD-10-CM | POA: Diagnosis not present

## 2023-03-13 DIAGNOSIS — R600 Localized edema: Secondary | ICD-10-CM | POA: Diagnosis not present

## 2023-03-13 DIAGNOSIS — E782 Mixed hyperlipidemia: Secondary | ICD-10-CM | POA: Diagnosis not present

## 2023-08-01 ENCOUNTER — Ambulatory Visit: Payer: BC Managed Care – PPO | Admitting: Allergy & Immunology

## 2023-09-24 ENCOUNTER — Ambulatory Visit: Payer: BC Managed Care – PPO | Admitting: Allergy & Immunology

## 2023-10-06 NOTE — Progress Notes (Unsigned)
Follow Up Note  RE: Michael Reynolds MRN: [id number] DOB: 10-29-1971 Date of Office Visit: 10/07/2023  Referring provider: Deatra James, MD Primary care provider: Deatra James, MD  Chief Complaint: No chief complaint on file.  History of Present Illness: I had the pleasure of seeing Michael Reynolds for a [facility]. He is a 52 y.o. male, who is being followed for asthma, GERD, allergic rhinoconjunctivitis. His previous allergy office visit was on 01/31/2023 with Dr. Dellis Anes. Today is a regular follow up visit.  Discussed the use of AI scribe software for clinical note transcription with the patient, who gave verbal consent to proceed.  History of Present Illness            1. Mild persistent asthma, uncomplicated - Lung testing looked stable today.  - We are going to send in [place] (contains three medications to help with breathing). - There is a $0 copay card provided.  - Call us if it is too expensive.  - Daily controller medication(s): Breztri two puffs twice daily  - Prior to physical activity: albuterol 2 puffs 10-15 minutes before physical activity. - Rescue medications: albuterol 4 puffs every 4-6 hours as needed - Asthma control goals:  * Full participation in all desired activities (may need albuterol before activity) * Albuterol use two time or less a week on average (not counting use with activity) * Cough interfering with sleep two time or less a month * Oral steroids no more than once a year * No hospitalizations   2. Gastroesophageal reflux disease - Continue with omeprazole as needed.    3. Allergic rhinoconjunctivitis - Continue with Xyzal and you can use up to twice daily if needed. - Continue with Nasacort 1-2 sprays per nostril daily when things get particularly well.  - Call us with issues.   Assessment and Plan: Ladarren is a 52 y.o. male with: *** Assessment and Plan              No  follow-ups on file.  No orders of the defined types were placed in this encounter.  Lab Orders  No laboratory test(s) ordered today    Diagnostics: Spirometry:  Tracings reviewed. His effort: {Blank single:19197::"Good reproducible efforts.","It was hard to get consistent efforts and there is a question as to whether this reflects a maximal maneuver.","Poor effort, data can not be interpreted."} FVC: ***L FEV1: ***L, ***% predicted FEV1/FVC ratio: ***% Interpretation: {Blank single:19197::"Spirometry consistent with mild obstructive disease","Spirometry consistent with moderate obstructive disease","Spirometry consistent with severe obstructive disease","Spirometry consistent with possible restrictive disease","Spirometry consistent with mixed obstructive and restrictive disease","Spirometry uninterpretable due to technique","Spirometry consistent with normal pattern","No overt abnormalities noted given today's efforts"}.  Please see scanned spirometry results for details.  Skin Testing: {Blank single:19197::"Select foods","Environmental allergy panel","Environmental allergy panel and select foods","Food allergy panel","None","Deferred due to recent antihistamines use"}. *** Results discussed with patient/family.   Medication List:  Current Outpatient Medications  Medication Sig Dispense Refill   albuterol (VENTOLIN HFA) 108 (90 Base) MCG/ACT inhaler Inhale 2 puffs into the lungs every 4 (four) hours as needed for wheezing or shortness of breath. 18 g 1   atorvastatin (LIPITOR) 80 MG tablet Take 80 mg by mouth daily.     Budeson-Glycopyrrol-Formoterol (BREZTRI AEROSPHERE) 160-9-4.8 MCG/ACT AERO Inhale 2 puffs into the lungs in the morning and at bedtime. 1 each 5   ezetimibe (ZETIA) 10 MG tablet Take 10 mg by mouth daily.  fluticasone-salmeterol (ADVAIR) 250-50 MCG/ACT AEPB Inhale 1 puff into the lungs in the morning and at bedtime. 1 each 5   hydrochlorothiazide (HYDRODIURIL) 25 MG  tablet TAKE 1 TABLET BY MOUTH EVERY DAY IN THE MORNING     levocetirizine (XYZAL) 5 MG tablet Take 1 tablet (5 mg total) by mouth every evening. 30 tablet 5   lisinopril (PRINIVIL,ZESTRIL) 20 MG tablet Take 20 mg by mouth daily.     omeprazole (PRILOSEC) 20 MG capsule Take 20 mg by mouth as needed. Reported on 04/12/2016     triamcinolone (NASACORT) 55 MCG/ACT AERO nasal inhaler Place 2 sprays into the nose daily. 16.9 mL 5   No current facility-administered medications for this visit.   Allergies: Allergies  Allergen Reactions   Dicloxacillin Anaphylaxis   Latex Rash and Hives   I reviewed his past medical history, social history, family history, and environmental history and no significant changes have been reported from his previous visit.  Review of Systems  Constitutional:  Negative for appetite change, chills, fever and unexpected weight change.  HENT:  Negative for congestion and rhinorrhea.   Eyes:  Negative for itching.  Respiratory:  Negative for cough, chest tightness, shortness of breath and wheezing.   Cardiovascular:  Negative for chest pain.  Gastrointestinal:  Negative for abdominal pain.  Genitourinary:  Negative for difficulty urinating.  Skin:  Negative for rash.  Allergic/Immunologic: Positive for environmental allergies.  Neurological:  Negative for headaches.    Objective: There were no vitals taken for this visit. There is no height or weight on file to calculate BMI. Physical Exam Vitals and nursing note reviewed.  Constitutional:      Appearance: Normal appearance. He is well-developed.  HENT:     Head: Normocephalic and atraumatic.     Right Ear: Tympanic membrane and external ear normal.     Left Ear: Tympanic membrane and external ear normal.     Nose: Nose normal.     Mouth/Throat:     Mouth: Mucous membranes are moist.     Pharynx: Oropharynx is clear.  Eyes:     Conjunctiva/sclera: Conjunctivae normal.  Cardiovascular:     Rate and Rhythm:  Normal rate and regular rhythm.     Heart sounds: Normal heart sounds. No murmur heard.    No friction rub. No gallop.  Pulmonary:     Effort: Pulmonary effort is normal.     Breath sounds: Normal breath sounds. No wheezing, rhonchi or rales.  Musculoskeletal:     Cervical back: Neck supple.  Skin:    General: Skin is warm.     Findings: No rash.  Neurological:     Mental Status: He is alert and oriented to person, place, and time.  Psychiatric:        Behavior: Behavior normal.    Previous notes and tests were reviewed. The plan was reviewed with the patient/family, and all questions/concerned were addressed.  It was my pleasure to see Michael Reynolds today and participate in his care. Please feel free to contact me with any questions or concerns.  Sincerely,  Wyline Mood, DO Allergy & Immunology  [facility] office: [phone] [facility] office: [phone]

## 2023-10-07 ENCOUNTER — Other Ambulatory Visit: Payer: Self-pay

## 2023-10-07 ENCOUNTER — Encounter: Payer: Self-pay | Admitting: Allergy

## 2023-10-07 ENCOUNTER — Ambulatory Visit: Payer: BC Managed Care – PPO | Admitting: Allergy

## 2023-10-07 VITALS — BP 110/88 | HR 70 | Temp 98.8°F | Ht 67.75 in | Wt 267.0 lb

## 2023-10-07 DIAGNOSIS — J3089 Other allergic rhinitis: Secondary | ICD-10-CM | POA: Diagnosis not present

## 2023-10-07 DIAGNOSIS — J4540 Moderate persistent asthma, uncomplicated: Secondary | ICD-10-CM | POA: Diagnosis not present

## 2023-10-07 MED ORDER — MONTELUKAST SODIUM 10 MG PO TABS: 10.0000 mg | ORAL_TABLET | Freq: Every day | ORAL | 3 refills | Status: AC

## 2023-10-07 MED ORDER — ALBUTEROL SULFATE HFA 108 (90 BASE) MCG/ACT IN AERS: 2.0000 | INHALATION_SPRAY | RESPIRATORY_TRACT | 1 refills | Status: AC | PRN

## 2023-10-07 MED ORDER — BUDESONIDE-FORMOTEROL FUMARATE 160-4.5 MCG/ACT IN AERO: 2.0000 | INHALATION_SPRAY | Freq: Two times a day (BID) | RESPIRATORY_TRACT | 3 refills | Status: DC

## 2023-10-07 NOTE — Patient Instructions (Addendum)
Asthma Daily controller medication(s): START Symbicort 2 puffs twice a day with spacer and rinse mouth afterwards. If too expensive let us know.  Check the pricing for the following inhalers: Dulera Advair HFA Advair Diskus Wixela Breo Trelegy - this also has a coupon card online.  May use albuterol rescue inhaler 2 puffs every 4 to 6 hours as needed for shortness of breath, chest tightness, coughing, and wheezing.  Monitor frequency of use - if you need to use it more than twice per week on a consistent basis let us know.  Breathing control goals:  Full participation in all desired activities (may need albuterol before activity) Albuterol use two times or less a week on average (not counting use with activity) Cough interfering with sleep two times or less a month Oral steroids no more than once a year No hospitalizations   2. Allergic rhinoconjunctivitis Use over the counter antihistamines such as Zyrtec (cetirizine), Claritin (loratadine), Allegra (fexofenadine), or Xyzal (levocetirizine) daily as needed. May take twice a day during allergy flares. May switch antihistamines every few months. Start Singulair (montelukast) 10mg  daily at night. Cautioned that in some children/adults can experience behavioral changes including hyperactivity, agitation, depression, sleep disturbances and suicidal ideations. These side effects are rare, but if you notice them you should notify me and discontinue Singulair (montelukast). Use Nasacort (triamcinolone) nasal spray 2 sprays per nostril once a day as needed for nasal congestion.   Return for allergy skin testing. Will make additional recommendations based on results. If significant positives will recommend allergy injections - handout given. Make sure you don't take any antihistamines for 3 days before the skin testing appointment. Don't put any lotion on the back and arms on the day of testing.  Plan on being here for 30-60 minutes.    Follow up in 3 months or sooner if needed.   Control of House Dust Mite Allergen Dust mite allergens are a common trigger of allergy and asthma symptoms. While they can be found throughout the house, these microscopic creatures thrive in warm, humid environments such as bedding, upholstered furniture and carpeting. Because so much time is spent in the bedroom, it is essential to reduce mite levels there.  Encase pillows, mattresses, and box springs in special allergen-proof fabric covers or airtight, zippered plastic covers.  Bedding should be washed weekly in hot water (130 F) and dried in a hot dryer. Allergen-proof covers are available for comforters and pillows that can't be regularly washed.  Wash the allergy-proof covers every few months. Minimize clutter in the bedroom. Keep pets out of the bedroom.  Keep humidity less than 50% by using a dehumidifier or air conditioning. You can buy a humidity measuring device called a hygrometer to monitor this.  If possible, replace carpets with hardwood, linoleum, or washable area rugs. If that's not possible, vacuum frequently with a vacuum that has a HEPA filter. Remove all upholstered furniture and non-washable window drapes from the bedroom. Remove all non-washable stuffed toys from the bedroom.  Wash stuffed toys weekly.

## 2023-10-16 ENCOUNTER — Telehealth: Payer: Self-pay | Admitting: Allergy

## 2023-10-16 DIAGNOSIS — I10 Essential (primary) hypertension: Secondary | ICD-10-CM | POA: Diagnosis not present

## 2023-10-16 DIAGNOSIS — R600 Localized edema: Secondary | ICD-10-CM | POA: Diagnosis not present

## 2023-10-16 DIAGNOSIS — Z125 Encounter for screening for malignant neoplasm of prostate: Secondary | ICD-10-CM | POA: Diagnosis not present

## 2023-10-16 DIAGNOSIS — Z23 Encounter for immunization: Secondary | ICD-10-CM | POA: Diagnosis not present

## 2023-10-16 DIAGNOSIS — E782 Mixed hyperlipidemia: Secondary | ICD-10-CM | POA: Diagnosis not present

## 2023-10-16 DIAGNOSIS — K219 Gastro-esophageal reflux disease without esophagitis: Secondary | ICD-10-CM | POA: Diagnosis not present

## 2023-10-16 DIAGNOSIS — Z0000 Encounter for general adult medical examination without abnormal findings: Secondary | ICD-10-CM | POA: Diagnosis not present

## 2023-10-16 NOTE — Telephone Encounter (Signed)
Called patient back - DOB/Need Updated DPR - unable to leave a voicemail, per recording the person you are calling can not be reached at this, please hang up and try your call again later.  When patient call back please advise the following: He can come by the office to complete a program assistance form for Trelegy and/or Advair to see if he can get approved or google GoodRx, WellRx for coupons that he would give to the pharmacy to process for discounts.

## 2023-10-16 NOTE — Telephone Encounter (Signed)
pt states trelogy is not covered and coupon did not help much, he can get a coupon for [employer] - pharmacy [facility] [place], [address]

## 2023-11-26 ENCOUNTER — Other Ambulatory Visit: Payer: Self-pay | Admitting: Family Medicine

## 2023-11-28 ENCOUNTER — Ambulatory Visit
Admission: EM | Admit: 2023-11-28 | Discharge: 2023-11-28 | Disposition: A | Discharge status: Home / Self Care | Payer: BC Managed Care – PPO | Attending: Emergency Medicine | Admitting: Emergency Medicine

## 2023-11-28 DIAGNOSIS — J101 Influenza due to other identified influenza virus with other respiratory manifestations: Secondary | ICD-10-CM | POA: Diagnosis not present

## 2023-11-28 LAB — POC COVID19/FLU A&B COMBO
Covid Antigen, POC: NEGATIVE
Influenza A Antigen, POC: POSITIVE — AB
Influenza B Antigen, POC: NEGATIVE

## 2023-11-28 MED ORDER — OSELTAMIVIR PHOSPHATE 75 MG PO CAPS: 75.0000 mg | ORAL_CAPSULE | Freq: Two times a day (BID) | ORAL | 0 refills | Status: AC

## 2023-11-28 NOTE — Discharge Instructions (Addendum)
Take the Tamiflu as directed.  Follow-up with your primary care provider if your symptoms are not improving.

## 2023-11-28 NOTE — ED Triage Notes (Signed)
Cough, congestion, chills, body aches, headache that started Tuesday. Taking mucinex DM and ibuprofen.

## 2023-11-28 NOTE — ED Provider Notes (Signed)
Michael Reynolds    CSN: [id number] Arrival date & time: 11/28/23  0865      History   Chief Complaint Chief Complaint  Patient presents with   Cough   Fever    HPI Michael Reynolds is a 53 y.o. male.  Patient presents on day 2 of fever, chills, body aches, headache, congestion, cough.  He has been treating his symptoms with ibuprofen and Mucinex.  No wheezing or shortness of breath.  His medical history includes asthma and allergies.  The history is provided by the patient and medical records.    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 Authorizing Provider  atorvastatin (LIPITOR) 80 MG tablet Take 80 mg by mouth daily.   Yes [provider]  ezetimibe (ZETIA) 10 MG tablet Take 10 mg by mouth daily. 06/23/20  Yes [provider]  hydrochlorothiazide (HYDRODIURIL) 25 MG tablet TAKE 1 TABLET BY MOUTH EVERY DAY IN THE MORNING 04/27/19  Yes [provider]  levocetirizine (XYZAL) 5 MG  tablet Take 1 tablet (5 mg total) by mouth every evening. 01/29/23  Yes Alfonse Spruce, MD  lisinopril (PRINIVIL,ZESTRIL) 20 MG tablet Take 20 mg by mouth daily.   Yes [provider]  oseltamivir (TAMIFLU) 75 MG capsule Take 1 capsule (75 mg total) by mouth every 12 (twelve) hours. 11/28/23  Yes Mickie Bail, NP  triamcinolone (NASACORT) 55 MCG/ACT AERO nasal inhaler Place 2 sprays into the nose daily. 01/29/23  Yes Alfonse Spruce, MD  albuterol (VENTOLIN HFA) 108 (90 Base) MCG/ACT inhaler Inhale 2 puffs into the lungs every 4 (four) hours as needed for wheezing or shortness of breath (coughing fits). 10/07/23   Ellamae Sia, DO  budesonide-formoterol (SYMBICORT) 160-4.5 MCG/ACT inhaler Inhale 2 puffs into the lungs in the morning and at bedtime. with spacer and rinse mouth afterwards. 10/07/23   Ellamae Sia, DO  montelukast (SINGULAIR) 10 MG tablet Take 1 tablet (10 mg total) by mouth at bedtime. Patient not taking: Reported on 11/28/2023 10/07/23   Ellamae Sia, DO  omeprazole (PRILOSEC) 20 MG capsule Take 20 mg by mouth as needed. Reported on 04/12/2016    [provider]    Family History Family History  Problem Relation Age of Onset   Allergic rhinitis Sister     Social History Social History   Tobacco Use   Smoking status: Never   Smokeless tobacco: Never  Vaping Use   Vaping status: Never Used  Substance  Use Topics   Alcohol use: No   Drug use: Never     Allergies   Dicloxacillin and Latex   Review of Systems Review of Systems  Constitutional:  Positive for chills and fever.  HENT:  Positive for congestion. Negative for ear pain and sore throat.   Respiratory:  Positive for cough. Negative for shortness of breath.   Neurological:  Positive for headaches.     Physical Exam Triage Vital Signs ED Triage Vitals  Encounter Vitals Group     BP 11/28/23 0834 (!) 145/83     Systolic BP Percentile --      Diastolic BP Percentile --      Pulse  Rate 11/28/23 0829 (!) 103     Resp 11/28/23 0829 18     Temp 11/28/23 0829 (!) 100.6 F (38.1 C)     Temp src --      SpO2 11/28/23 0829 95 %     Weight --      Height --      Head Circumference --      Peak Flow --      Pain Score 11/28/23 0836 3     Pain Loc --      Pain Education --      Exclude from Growth Chart --    No data found.  Updated Vital Signs BP (!) 145/83 (BP Location: Left Arm)   Pulse (!) 103   Temp (!) 100.6 F (38.1 C)   Resp 18   SpO2 95%   Visual Acuity Right Eye Distance:   Left Eye Distance:   Bilateral Distance:    Right Eye Near:   Left Eye Near:    Bilateral Near:     Physical Exam Constitutional:      General: He is not in acute distress. HENT:     Right Ear: Tympanic membrane normal.     Left Ear: Tympanic membrane normal.     Nose: Rhinorrhea present.     Mouth/Throat:     Mouth: Mucous membranes are moist.     Pharynx: Oropharynx is clear.  Cardiovascular:     Rate and Rhythm: Normal rate and regular rhythm.     Heart sounds: Normal heart sounds.  Pulmonary:     Effort: Pulmonary effort is normal. No respiratory distress.     Breath sounds: Normal breath sounds. No wheezing.  Neurological:     Mental Status: He is alert.      UC Treatments / Results  Labs (all labs ordered are listed, but only abnormal results are displayed) Labs Reviewed  POC COVID19/FLU A&B COMBO - Abnormal; Notable for the following components:      Result Value   Influenza A Antigen, POC Positive (*)    All other components within normal limits    EKG   Radiology No results found.  Procedures Procedures (including critical care time)  Medications Ordered in UC Medications - No data to display  Initial Impression / Assessment and Plan / UC Course  I have reviewed the triage vital signs and the nursing notes.  Pertinent labs & imaging results that were available during my care of the patient were reviewed by me and considered in my  medical decision making (see chart for details).    Influenza A.  Rapid flu test positive for influenza A.  COVID negative.  Treating with Tamiflu.  Discussed symptomatic treatment including Tylenol or ibuprofen as needed, rest, hydration.  Education provided on influenza.  Instructed patient to follow-up with PCP if symptoms are not improving.  Patient agrees to plan of care.   Final Clinical Impressions(s) / UC Diagnoses   Final diagnoses:  Influenza A     Discharge Instructions      Take the Tamiflu as directed.  Follow-up with your primary care provider if your symptoms are not improving.      ED Prescriptions     Medication Sig Dispense Auth. Provider   oseltamivir (TAMIFLU) 75 MG capsule Take 1 capsule (75 mg total) by mouth every 12 (twelve) hours. 10 capsule Mickie Bail, NP      PDMP not reviewed this encounter.   Mickie Bail, NP 11/28/23 [phone]

## 2023-12-12 MED ORDER — FLUTICASONE-SALMETEROL 250-50 MCG/ACT IN AEPB: 1.0000 | INHALATION_SPRAY | Freq: Two times a day (BID) | RESPIRATORY_TRACT | 3 refills | Status: AC

## 2023-12-12 NOTE — Telephone Encounter (Signed)
Patient called stating Michael Reynolds would be better for him than Michael Reynolds. Patient states he has a coupon for [employer] and that will work better than the coupon he had with the Michael Reynolds.

## 2023-12-12 NOTE — Telephone Encounter (Signed)
I sent in Wixela 1 puff twice a day and rinse mouth after each use.

## 2023-12-12 NOTE — Addendum Note (Signed)
Addended by: Ellamae Sia on: 12/12/2023 01:13 PM   Modules accepted: Orders

## 2023-12-12 NOTE — Telephone Encounter (Signed)
Do you want pt on trelegy or wixela

## 2024-01-28 DIAGNOSIS — H6991 Unspecified Eustachian tube disorder, right ear: Secondary | ICD-10-CM | POA: Diagnosis not present

## 2024-02-25 DIAGNOSIS — H6501 Acute serous otitis media, right ear: Secondary | ICD-10-CM | POA: Diagnosis not present

## 2024-02-25 DIAGNOSIS — H6991 Unspecified Eustachian tube disorder, right ear: Secondary | ICD-10-CM | POA: Diagnosis not present

## 2024-03-09 DIAGNOSIS — H903 Sensorineural hearing loss, bilateral: Secondary | ICD-10-CM | POA: Diagnosis not present

## 2024-03-09 DIAGNOSIS — H9313 Tinnitus, bilateral: Secondary | ICD-10-CM | POA: Diagnosis not present

## 2024-03-09 DIAGNOSIS — H6993 Unspecified Eustachian tube disorder, bilateral: Secondary | ICD-10-CM | POA: Diagnosis not present

## 2024-04-29 DIAGNOSIS — R600 Localized edema: Secondary | ICD-10-CM | POA: Diagnosis not present

## 2024-04-29 DIAGNOSIS — I10 Essential (primary) hypertension: Secondary | ICD-10-CM | POA: Diagnosis not present

## 2024-04-29 DIAGNOSIS — E782 Mixed hyperlipidemia: Secondary | ICD-10-CM | POA: Diagnosis not present

## 2024-05-12 DIAGNOSIS — I10 Essential (primary) hypertension: Secondary | ICD-10-CM | POA: Diagnosis not present

## 2024-11-25 ENCOUNTER — Other Ambulatory Visit: Payer: Self-pay | Admitting: Allergy

## 2024-12-07 ENCOUNTER — Ambulatory Visit: Admitting: Allergy
